# Patient Record
Sex: Female | Born: 1970
Health system: Southern US, Community
[De-identification: ages and names within clinical notes are randomized; demographics above are authoritative.]

## PROBLEM LIST (undated history)

## (undated) ENCOUNTER — Emergency Department (HOSPITAL_COMMUNITY): Payer: Medicare Other | Source: Home / Self Care

## (undated) DIAGNOSIS — I10 Essential (primary) hypertension: Secondary | ICD-10-CM

## (undated) DIAGNOSIS — J45909 Unspecified asthma, uncomplicated: Secondary | ICD-10-CM

## (undated) DIAGNOSIS — E119 Type 2 diabetes mellitus without complications: Secondary | ICD-10-CM

---

## 1999-01-22 ENCOUNTER — Other Ambulatory Visit: Admission: RE | Admit: 1999-01-22 | Discharge: 1999-01-22 | Payer: Self-pay | Admitting: Family Medicine

## 1999-08-24 ENCOUNTER — Other Ambulatory Visit: Admission: RE | Admit: 1999-08-24 | Discharge: 1999-08-24 | Payer: Self-pay | Admitting: Family Medicine

## 2002-08-13 ENCOUNTER — Encounter: Payer: Self-pay | Admitting: *Deleted

## 2002-08-13 ENCOUNTER — Inpatient Hospital Stay (HOSPITAL_COMMUNITY): Admission: AD | Admit: 2002-08-13 | Discharge: 2002-08-16 | Payer: Self-pay | Admitting: *Deleted

## 2002-08-14 ENCOUNTER — Encounter (INDEPENDENT_AMBULATORY_CARE_PROVIDER_SITE_OTHER): Payer: Self-pay

## 2004-04-07 ENCOUNTER — Ambulatory Visit: Payer: Self-pay | Admitting: Family Medicine

## 2004-06-07 ENCOUNTER — Ambulatory Visit: Payer: Self-pay | Admitting: Family Medicine

## 2004-08-09 ENCOUNTER — Ambulatory Visit: Payer: Self-pay | Admitting: Family Medicine

## 2004-11-01 ENCOUNTER — Ambulatory Visit: Payer: Self-pay | Admitting: Family Medicine

## 2004-11-26 ENCOUNTER — Encounter: Admission: RE | Admit: 2004-11-26 | Discharge: 2004-11-26 | Payer: Self-pay | Admitting: Allergy and Immunology

## 2004-11-29 ENCOUNTER — Ambulatory Visit: Payer: Self-pay | Admitting: Family Medicine

## 2004-11-30 ENCOUNTER — Encounter (INDEPENDENT_AMBULATORY_CARE_PROVIDER_SITE_OTHER): Payer: Self-pay | Admitting: Cardiology

## 2004-11-30 ENCOUNTER — Ambulatory Visit (HOSPITAL_COMMUNITY): Admission: RE | Admit: 2004-11-30 | Discharge: 2004-11-30 | Payer: Self-pay | Admitting: Allergy and Immunology

## 2004-12-07 ENCOUNTER — Ambulatory Visit: Payer: Self-pay | Admitting: Family Medicine

## 2005-07-13 ENCOUNTER — Ambulatory Visit: Payer: Self-pay | Admitting: Family Medicine

## 2005-08-17 ENCOUNTER — Other Ambulatory Visit: Admission: RE | Admit: 2005-08-17 | Discharge: 2005-08-17 | Payer: Self-pay | Admitting: Family Medicine

## 2005-08-17 ENCOUNTER — Ambulatory Visit: Payer: Self-pay | Admitting: Family Medicine

## 2005-08-17 ENCOUNTER — Encounter (INDEPENDENT_AMBULATORY_CARE_PROVIDER_SITE_OTHER): Payer: Self-pay | Admitting: Family Medicine

## 2006-02-03 ENCOUNTER — Ambulatory Visit: Payer: Self-pay | Admitting: Family Medicine

## 2006-02-06 ENCOUNTER — Ambulatory Visit: Payer: Self-pay | Admitting: Family Medicine

## 2006-05-03 ENCOUNTER — Ambulatory Visit: Payer: Self-pay | Admitting: Family Medicine

## 2006-08-14 ENCOUNTER — Encounter (INDEPENDENT_AMBULATORY_CARE_PROVIDER_SITE_OTHER): Payer: Self-pay | Admitting: Family Medicine

## 2006-08-14 DIAGNOSIS — F8189 Other developmental disorders of scholastic skills: Secondary | ICD-10-CM

## 2006-08-14 DIAGNOSIS — Z8669 Personal history of other diseases of the nervous system and sense organs: Secondary | ICD-10-CM

## 2006-08-14 DIAGNOSIS — J309 Allergic rhinitis, unspecified: Secondary | ICD-10-CM

## 2006-08-14 DIAGNOSIS — J45909 Unspecified asthma, uncomplicated: Secondary | ICD-10-CM | POA: Insufficient documentation

## 2007-10-23 ENCOUNTER — Emergency Department (HOSPITAL_COMMUNITY): Admission: EM | Admit: 2007-10-23 | Discharge: 2007-10-23 | Payer: Self-pay | Admitting: Emergency Medicine

## 2009-02-04 ENCOUNTER — Ambulatory Visit: Payer: Self-pay | Admitting: Physician Assistant

## 2009-02-04 DIAGNOSIS — N926 Irregular menstruation, unspecified: Secondary | ICD-10-CM | POA: Insufficient documentation

## 2009-02-04 DIAGNOSIS — K219 Gastro-esophageal reflux disease without esophagitis: Secondary | ICD-10-CM

## 2009-02-10 ENCOUNTER — Encounter: Payer: Self-pay | Admitting: Physician Assistant

## 2009-02-10 LAB — CONVERTED CEMR LAB
ALT: 9 units/L (ref 0–35)
AST: 14 units/L (ref 0–37)
Alkaline Phosphatase: 79 units/L (ref 39–117)
BUN: 9 mg/dL (ref 6–23)
Barbiturate Quant, Ur: NEGATIVE
Basophils Absolute: 0 10*3/uL (ref 0.0–0.1)
Basophils Relative: 0 % (ref 0–1)
Calcium: 9.3 mg/dL (ref 8.4–10.5)
Cocaine Metabolites: NEGATIVE
Creatinine, Ser: 0.62 mg/dL (ref 0.40–1.20)
Creatinine,U: 199.4 mg/dL
Eosinophils Absolute: 0.1 10*3/uL (ref 0.0–0.7)
Eosinophils Relative: 2 % (ref 0–5)
HCT: 33.7 % — ABNORMAL LOW (ref 36.0–46.0)
HDL: 44 mg/dL (ref >39)
Lymphs Abs: 2 10*3/uL (ref 0.7–4.0)
MCV: 87.8 fL (ref 78.0–100.0)
Methadone: NEGATIVE
Neutrophils Relative %: 54 % (ref 43–77)
Opiate Screen, Urine: NEGATIVE
Platelets: 243 10*3/uL (ref 150–400)
RDW: 14.1 % (ref 11.5–15.5)
TSH: 0.738 microintl units/mL (ref 0.350–4.500)
Total Bilirubin: 0.3 mg/dL (ref 0.3–1.2)
Total CHOL/HDL Ratio: 3.8
VLDL: 12 mg/dL (ref 0–40)
WBC: 5.5 10*3/uL (ref 4.0–10.5)

## 2009-02-11 DIAGNOSIS — D509 Iron deficiency anemia, unspecified: Secondary | ICD-10-CM

## 2009-02-12 ENCOUNTER — Ambulatory Visit (HOSPITAL_COMMUNITY): Admission: RE | Admit: 2009-02-12 | Discharge: 2009-02-12 | Payer: Self-pay | Admitting: Internal Medicine

## 2009-02-18 LAB — CONVERTED CEMR LAB
Ferritin: 10 ng/mL (ref 10–291)
Saturation Ratios: 6 % — ABNORMAL LOW (ref 20–55)
Vitamin B-12: 243 pg/mL (ref 211–911)

## 2009-02-20 ENCOUNTER — Ambulatory Visit: Payer: Self-pay | Admitting: Internal Medicine

## 2009-02-20 ENCOUNTER — Encounter: Payer: Self-pay | Admitting: Physician Assistant

## 2009-02-24 ENCOUNTER — Telehealth: Payer: Self-pay | Admitting: Physician Assistant

## 2009-02-24 LAB — CONVERTED CEMR LAB: Retic Ct Pct: 1.9 % (ref 0.4–3.1)

## 2009-03-16 ENCOUNTER — Ambulatory Visit: Payer: Self-pay | Admitting: Physician Assistant

## 2009-03-16 DIAGNOSIS — R030 Elevated blood-pressure reading, without diagnosis of hypertension: Secondary | ICD-10-CM | POA: Insufficient documentation

## 2009-03-16 DIAGNOSIS — E049 Nontoxic goiter, unspecified: Secondary | ICD-10-CM | POA: Insufficient documentation

## 2009-03-16 LAB — CONVERTED CEMR LAB
Basophils Absolute: 0 10*3/uL (ref 0.0–0.1)
Basophils Relative: 0 % (ref 0–1)
Eosinophils Absolute: 0.1 10*3/uL (ref 0.0–0.7)
Eosinophils Relative: 1 % (ref 0–5)
Glucose, Urine, Semiquant: NEGATIVE
HCT: 35.1 % — ABNORMAL LOW (ref 36.0–46.0)
Hemoglobin: 11.2 g/dL — ABNORMAL LOW (ref 12.0–15.0)
KOH Prep: NEGATIVE
Ketones, urine, test strip: NEGATIVE
MCHC: 31.9 g/dL (ref 30.0–36.0)
Monocytes Absolute: 0.8 10*3/uL (ref 0.1–1.0)
Nitrite: NEGATIVE
OCCULT 1: NEGATIVE
RDW: 14.5 % (ref 11.5–15.5)
WBC Urine, dipstick: NEGATIVE
pH: 6.5

## 2009-03-17 ENCOUNTER — Encounter: Payer: Self-pay | Admitting: Physician Assistant

## 2009-03-17 ENCOUNTER — Other Ambulatory Visit: Admission: RE | Admit: 2009-03-17 | Discharge: 2009-03-17 | Payer: Self-pay | Admitting: Internal Medicine

## 2009-03-20 ENCOUNTER — Encounter: Payer: Self-pay | Admitting: Physician Assistant

## 2009-03-21 ENCOUNTER — Encounter: Payer: Self-pay | Admitting: Physician Assistant

## 2009-03-24 ENCOUNTER — Ambulatory Visit (HOSPITAL_COMMUNITY): Admission: RE | Admit: 2009-03-24 | Discharge: 2009-03-24 | Payer: Self-pay | Admitting: Internal Medicine

## 2009-03-29 ENCOUNTER — Encounter: Payer: Self-pay | Admitting: Physician Assistant

## 2009-03-30 ENCOUNTER — Encounter (INDEPENDENT_AMBULATORY_CARE_PROVIDER_SITE_OTHER): Payer: Self-pay | Admitting: *Deleted

## 2009-04-27 ENCOUNTER — Ambulatory Visit: Payer: Self-pay | Admitting: Physician Assistant

## 2009-04-27 DIAGNOSIS — D485 Neoplasm of uncertain behavior of skin: Secondary | ICD-10-CM

## 2009-06-10 ENCOUNTER — Ambulatory Visit: Payer: Self-pay | Admitting: Obstetrics and Gynecology

## 2009-06-10 LAB — CONVERTED CEMR LAB
HCT: 36.1 % (ref 36.0–46.0)
MCHC: 32.4 g/dL (ref 30.0–36.0)
MCV: 86.4 fL (ref 78.0–100.0)
Platelets: 222 10*3/uL (ref 150–400)
RDW: 14.9 % (ref 11.5–15.5)

## 2010-04-06 NOTE — Miscellaneous (Signed)
Summary: Pap Smear Normal  Clinical Lists Changes  Observations: Added new observation of PAP SMEAR:  Specimen Adequacy: Satisfactory for evaluation.   Interpretation/Result:Negative for intraepithelial Lesion or Malignancy.    (03/16/2009 21:06)      Pap Smear  Procedure date:  03/16/2009  Findings:       Specimen Adequacy: Satisfactory for evaluation.   Interpretation/Result:Negative for intraepithelial Lesion or Malignancy.     Comments:      Repeat Pap in 1 year.

## 2010-04-06 NOTE — Assessment & Plan Note (Signed)
Summary: FOLLOW UP IN 6 WEEKS WITH Angela Daugherty FOR BP AND ANEMIA//GK   Vital Signs:  Patient profile:   40 year old female Height:      65.5 inches Weight:      217 pounds BMI:     35.69 Temp:     97.9 degrees F oral Pulse rate:   82 / minute Pulse rhythm:   regular Resp:     18 per minute BP sitting:   138 / 90  (left arm) Cuff size:   large  Vitals Entered By: Angela Daugherty (April 27, 2009 9:58 AM)  Serial Vital Signs/Assessments:  Time      Position  BP       Pulse  Resp  Temp     By 10:44 AM            130/94                         Angela Newcomer PA-C 10:45 AM            146/98                         Angela Newcomer PA-C  Comments: 10:44 AM left arm   By: Angela Newcomer PA-C  10:45 AM right arm  By: Angela Newcomer PA-C   CC: F/U... Is Patient Diabetic? No Pain Assessment Patient in pain? no       Does patient need assistance? Functional Status Self care Ambulation Normal   CC:  F/U....  History of Present Illness: Here for f/u.  Dysfunctional uterine bleeding:  Had a smal fibroid on ultrasound.  Mild iron deficient anemia.  Still not taking iron.  Has not gotten appt with GYN yet.  Now noting periods once a month.  No heavy cycle noted this month.  Thyromegaly:  Ultrasound with bilat nodules < 1cm.  No dominant nodule.  Patient had normal TSH.  Rec to f/u with ultrasound in 6 mos.    Asthma History    Asthma Control Assessment:    Age range: 12+ years    Symptoms: 0-2 days/week    Nighttime Awakenings: 0-2/month    Interferes w/ normal activity: no limitations    SABA use (not for EIB): 0-2 days/week    Asthma Control Assessment: Well Controlled   Problems Prior to Update: 1)  Lesion, Scalp  (ICD-238.2) 2)  Elevated Bp Reading Without Dx Hypertension  (ICD-796.2) 3)  Thyromegaly  (ICD-240.9) 4)  Anemia-iron Deficiency  (ICD-280.9) 5)  Irregular Menses  (ICD-626.4) 6)  Obesity  (ICD-278.00) 7)  Preventive Health Care  (ICD-V70.0) 8)  Gerd   (ICD-530.81) 9)  Family History Diabetes 1st Degree Relative  (ICD-V18.0) 10)  Learning Disability  (ICD-315.2) 11)  Meningitis, Hx of  (ICD-V12.49) 12)  Asthma  (ICD-493.90) 13)  Allergic Rhinitis  (ICD-477.9)  Current Medications (verified): 1)  Allegra 180 Mg Tabs (Fexofenadine Hcl) .Marland Kitchen.. 1 By Mouth Once Daily 2)  Nexium 40 Mg Cpdr (Esomeprazole Magnesium) .Marland Kitchen.. 1 By Mouth Once Daily 3)  Qvar 80 Mcg/act Aers (Beclomethasone Dipropionate) .Marland Kitchen.. 1 Inhalation Two Times A Day 4)  Proventil 90 Mcg/act Aers (Albuterol) .... As Needed 5)  Tessalon Perles 100 Mg Caps (Benzonatate) .... Q8h Pern Cough 6)  Ferrous Sulfate 325 (65 Fe) Mg Tabs (Ferrous Sulfate) .... Take 1 Tablet By Mouth Three Times A Day  Allergies (verified): No Known Drug Allergies  Review of Systems  See HPI General:  Denies chills, fever, sweats, and weight loss. CV:  Denies chest pain or discomfort. GI:  Denies bloody stools, dark tarry stools, and vomiting blood. GU:  Denies hematuria. Derm:  See HPI.  Physical Exam  General:  alert, well-developed, and well-nourished.   Head:  normocephalic and atraumatic.   Neck:  supple.   Lungs:  normal breath sounds, no crackles, and no wheezes.   Heart:  normal rate and regular rhythm.   Neurologic:  alert & oriented X3 and cranial nerves II-XII intact.   Skin:  approx 2 cm pedunculated mass with hypo and hyper pigmentation on scalp in the mid to post parietal region Psych:  normally interactive.   answers cell phone and starts talking during interview    Impression & Recommendations:  Problem # 1:  LESION, SCALP (ICD-238.2)  ? skin tag rec she see derm to have it removed  Orders: Dermatology Referral (Derma)  Problem # 2:  ELEVATED BP READING WITHOUT DX HYPERTENSION (ICD-796.2) continue to monitor closely will have her watch salt and exercise if still above 140/90 at bp check, consider putting her on HCTZ 12.5 mg once daily  Problem # 3:  THYROMEGALY  (ICD-240.9) < 1cm nodules needs f/u TFTs and U/S in 6 mos  Problem # 4:  ASTHMA (ICD-493.90) controlled  Her updated medication list for this problem includes:    Qvar 80 Mcg/act Aers (Beclomethasone dipropionate) .Marland Kitchen... 1 inhalation two times a day    Proventil 90 Mcg/act Aers (Albuterol) .Marland Kitchen... As needed  Problem # 5:  ANEMIA-IRON DEFICIENCY (ICD-280.9) start Iron waiting on appt with GYN  Her updated medication list for this problem includes:    Ferrous Sulfate 325 (65 Fe) Mg Tabs (Ferrous sulfate) .Marland Kitchen... Take 1 tablet by mouth three times a day  Complete Medication List: 1)  Allegra 180 Mg Tabs (Fexofenadine hcl) .Marland Kitchen.. 1 by mouth once daily 2)  Nexium 40 Mg Cpdr (Esomeprazole magnesium) .Marland Kitchen.. 1 by mouth once daily 3)  Qvar 80 Mcg/act Aers (Beclomethasone dipropionate) .Marland Kitchen.. 1 inhalation two times a day 4)  Proventil 90 Mcg/act Aers (Albuterol) .... As needed 5)  Tessalon Perles 100 Mg Caps (Benzonatate) .... Q8h pern cough 6)  Ferrous Sulfate 325 (65 Fe) Mg Tabs (Ferrous sulfate) .... Take 1 tablet by mouth three times a day  Patient Instructions: 1)  It is important that you exercise reguarly at least 20 minutes 5 times a week. If you develop chest pain, have severe difficulty breathing, or feel very tired, stop exercising immediately and seek medical attention.  2)  Watch salt in your diet. 3)  See the handout. 4)  Return in 2 weeks for a blood pressure check with the nurse. 5)  Please schedule a follow-up appointment in 1 month with Angela Daugherty for blood pressure.

## 2010-04-06 NOTE — Letter (Signed)
Summary: *HSN Results Follow up  [facility]  [address] [place], [place] [address]   Phone: [phone]  Fax: [phone]      03/30/2009   Billiejean AURILLA COULIBALY [address] [place], [place]  [address]   Dear  Ms. Mekenna Hudson,                            ____S.Drinkard,FNP   ____D. Gore,FNP       ____B. McPherson,MD   ____V. Rankins,MD    ____E. Mulberry,MD    ____N. Daphine Deutscher, FNP  ____D. Reche Dixon, MD    ____K. Philipp Deputy, MD    ____Other     This letter is to inform you that your recent test(s):  _______Pap Smear    _______Lab Test     ___X____X-ray    ___X____ is within acceptable limits  _______ requires a medication change  _______ requires a follow-up lab visit  _______ requires a follow-up visit with your provider   Comments:  Your thyroid ultrasound is normal.  We would like for you to repeat the ultrasound in six (6) months.       _________________________________________________________ If you have any questions, please contact our office                     Sincerely,  [name] [facility]

## 2010-04-06 NOTE — Letter (Signed)
Summary: GYNECOLOGIC CYTOLOGY REPORT  GYNECOLOGIC CYTOLOGY REPORT   Imported By: Arta Bruce 05/08/2009 11:51:16  _____________________________________________________________________  External Attachment:    Type:   Image     Comment:   External Document

## 2010-04-06 NOTE — Letter (Signed)
Summary: *HSN Results Follow up  [facility]  [address] [place], [place] [address]   Phone: [phone]  Fax: [phone]      03/17/2009   Angela Daugherty [address] [place], [place]  [address]   Dear  [name],                            ____S.Drinkard,FNP   ____D. Gore,FNP       ____B. McPherson,MD   ____V. Rankins,MD    ____E. Mulberry,MD    ____N. Daphine Deutscher, FNP  ____D. Reche Dixon, MD    ____K. Philipp Deputy, MD    __x__S. Alben Spittle, PA-C     This letter is to inform you that your recent test(s):  _______Pap Smear    ___x____Lab Test     _______X-ray    _______ is within acceptable limits  _______ requires a medication change  _______ requires a follow-up lab visit  _______ requires a follow-up visit with your provider   Comments:  Blood counts are slightly improved.  Continue the iron.  Test for infection was negative.  Your pap smear results have not come back.  We will send you a letter once they do come back.       _________________________________________________________ If you have any questions, please contact our office                     Sincerely,  Tereso Newcomer PA-C [facility]

## 2010-04-06 NOTE — Assessment & Plan Note (Signed)
Summary: cpp////cns   Vital Signs:  Patient profile:   40 year old female LMP:     03/03/2009 Weight:      221 pounds Temp:     98.3 degrees F Pulse rate:   91 / minute Pulse rhythm:   regular Resp:     20 per minute BP sitting:   148 / 83  (left arm) Cuff size:   large  Vitals Entered By: Vesta Mixer CMA (March 16, 2009 2:11 PM) CC: CPP Is Patient Diabetic? No Pain Assessment Patient in pain? no       Does patient need assistance? Ambulation Normal LMP (date): 03/03/2009     Enter LMP: 03/03/2009 Last PAP Result Normal   CC:  CPP.  History of Present Illness: Here for CPP. When last seen had labs that demonstrated iron deficiency anemia. She has a long h/o menorrhagia.  Sometimes has 2-3 periods a month.  No cramping or clotting. No vaginal discharge, itching or odor. Rarely sexually active with one partner. Does not take calcium. Mammo done last month . . . . normal. No FHx breast CA or ovarian CA.   Asthma History    Asthma Control Assessment:    Age range: 12+ years    Symptoms: 0-2 days/week    Nighttime Awakenings: 0-2/month    Interferes w/ normal activity: no limitations    SABA use (not for EIB): 0-2 days/week    Asthma Control Assessment: Well Controlled  Habits & Providers  Alcohol-Tobacco-Diet     Alcohol drinks/day: 0     Tobacco Status: never  Exercise-Depression-Behavior     Does Patient Exercise: yes     Type of exercise: aerobics videos     Times/week: <3     Have you felt down or hopeless? no     Have you felt little pleasure in things? no     STD Risk: never     Drug Use: never     Seat Belt Use: always  Allergies (verified): No Known Drug Allergies  Family History: Family History Diabetes 1st degree relative - mom Family History Hypertension - mom no breast or ovarian cancer  Social History: Occupation: PT; Land Single Rarely sexually active; one partner One son 20 yo Never Smoked Alcohol  use-no Drug use-no Drug Use:  never STD Risk:  never Risk analyst Use:  always Does Patient Exercise:  yes  Review of Systems  The patient denies fever, chest pain, syncope, dyspnea on exertion, hemoptysis, melena, hematochezia, hematuria, depression, and breast masses.         see HPI rest of ROS negative  Physical Exam  General:  alert, well-developed, and well-nourished.   Head:  normocephalic and atraumatic.   Eyes:  pupils equal, pupils round, and pupils reactive to light.   Ears:  R ear normal and L ear normal.   Nose:  no external deformity.   Mouth:  pharynx pink and moist, no erythema, and no exudates.   Neck:  supple, no thyroid nodules or tenderness, no carotid bruits, no cervical lymphadenopathy, and thyromegaly.   Breasts:  skin/areolae normal, no masses, no abnormal thickening, no nipple discharge, no tenderness, and no adenopathy.   Lungs:  normal breath sounds, no crackles, and no wheezes.   Heart:  normal rate, regular rhythm, and no murmur.   Abdomen:  soft, non-tender, and no hepatomegaly.   Rectal:  no external abnormalities, no hemorrhoids, normal sphincter tone, and no masses.   Genitalia:  normal introitus, no  external lesions, no vaginal discharge, mucosa pink and moist, no vaginal or cervical lesions, no vaginal atrophy, no friaility or hemorrhage, normal uterus size and position, and no adnexal masses or tenderness.   Msk:  normal ROM.   Pulses:  DP/PT 2+ bilat Extremities:  no edema Neurologic:  alert & oriented X3 and cranial nerves II-XII intact.   Skin:  turgor normal.   Psych:  normally interactive and good eye contact.     Impression & Recommendations:  Problem # 1:  PREVENTIVE HEALTH CARE (ICD-V70.0) pap today  Orders: KOH/ WET Mount [phone]) T- GC Chlamydia ([address]) T-Pap Smear, Thin Prep (09811)  Problem # 2:  ANEMIA-IRON DEFICIENCY (ICD-280.9) on iron check cbc get stool cards likely related to menorrhagia get ultrasound . . .  consider referral to GYN clinic  Her updated medication list for this problem includes:    Ferrous Sulfate 325 (65 Fe) Mg Tabs (Ferrous sulfate) .Marland Kitchen... Take 1 tablet by mouth three times a day  Orders: T-CBC w/Diff ([phone]) Hemoccult Cards -3 specimans (take home) ([address]) Ultrasound (Ultrasound)  Problem # 3:  THYROMEGALY (ICD-240.9)  on exam recent TSH normal get u/s  Orders: Ultrasound (Ultrasound)  Problem # 4:  ELEVATED BP READING WITHOUT DX HYPERTENSION (ICD-796.2) monitor  Complete Medication List: 1)  Allegra 180 Mg Tabs (Fexofenadine hcl) .Marland Kitchen.. 1 by mouth once daily 2)  Nexium 40 Mg Cpdr (Esomeprazole magnesium) .Marland Kitchen.. 1 by mouth once daily 3)  Qvar 80 Mcg/act Aers (Beclomethasone dipropionate) .Marland Kitchen.. 1 inhalation two times a day 4)  Proventil 90 Mcg/act Aers (Albuterol) .... As needed 5)  Tessalon Perles 100 Mg Caps (Benzonatate) .... Q8h pern cough 6)  Ferrous Sulfate 325 (65 Fe) Mg Tabs (Ferrous sulfate) .... Take 1 tablet by mouth three times a day   Patient Instructions: 1)  Take calcium 600 mg +vitamin D 400 IU twice daily.  2)  Return in 2 weeks for blood pressure check with the nurse. 3)  Please schedule a follow-up appointment in 6 weeks with Glynna Failla for blood pressure and anemia. 4)     Laboratory Results   Urine Tests    Routine Urinalysis   Glucose: negative   (Normal Range: Negative) Bilirubin: negative   (Normal Range: Negative) Ketone: negative   (Normal Range: Negative) Spec. Gravity: <1.005   (Normal Range: 1.003-1.035) Blood: negative   (Normal Range: Negative) pH: 6.5   (Normal Range: 5.0-8.0) Protein: negative   (Normal Range: Negative) Urobilinogen: 1.0   (Normal Range: 0-1) Nitrite: negative   (Normal Range: Negative) Leukocyte Esterace: negative   (Normal Range: Negative)      Wet Mount Source: vaginal WBC/hpf: 1-5 Bacteria/hpf: rare Clue cells/hpf: none  Negative whiff Yeast/hpf: none Wet Mount KOH:  Negative Trichomonas/hpf: none  Stool - Occult Blood Hemmoccult #1: negative Date: 03/16/2009    Appended Document: cpp////cns US TRANSVAGINAL NON-OB - [id number]   Clinical Data: Irregular menses.  LMP 02/18/2009   TRANSABDOMINAL AND TRANSVAGINAL ULTRASOUND OF PELVIS   Technique:  Both transabdominal and transvaginal ultrasound examinations of the pelvis were performed including evaluation of the uterus, ovaries, adnexal regions, and pelvic cul-de-sac.   Comparison:  None.   Findings:   Uterus the uterus demonstrates a sagittal length of 9.3 cm, an AP width of 5.1 cm and a transverse width of 5.5 cm.  One small area of focally altered echotexture is identified in the posterior upper uterine segment measuring 8 x 8 by 7 mm.  This has a partial sub serosal component and  is compatible with a small fibroid.  The remainder of the myometrium is homogeneous   Endometrium is homogeneously echogenic and demonstrates an AP width of 11.9 mm.  No areas of focal thickening or inhomogeneity are seen and this would correlate with a presecretory endometrial stripe and the patient's given LMP of 02/18/2009.   Right Ovary measures 4.8 x 2.6 x 3.2 cm.  The central stroma has a somewhat increased echotexture which can sometimes be seen with polycystic ovarian syndrome.  No increase in central vascularity or peripheral follicles are seen however   Left Ovary measures 5.8 x 2.1 by 2.8 cm.  Again the central stroma has a slightly increased echogenicity but no increased central flow or increase in peripherally oriented follicles are noted.   Other Findings:  No pelvic fluid or separate adnexal masses are noted.   IMPRESSION: Small focal fibroid with size and location as described above. Normal presecretory endometrial stripe.  It should be noted that in this phase of the cycle, small focal abnormalities can be obscured and if clinical concern warrants, reassessment in the  immediate postsecretory phase of the cycle would be useful.   Both ovaries are slightly prominent in size and demonstrates some increased central echogenicity.  These findings can be seen with polycystic ovarian syndrome, however the ancillary findings of increased central vascularity and peripherally oriented follicles are not seen. Clinical correlation is recommended.   Read By:  Bertha Stakes,  M.D.     Released By:  Bertha Stakes,  M.D.  _____________________________________________________________________  External Attachment:    Type:     Image     Comment:  US TRANSVAGINAL NON-OB - [id number]  Signed by Tereso Newcomer PA-C on 03/29/2009 at 9:33 PM  ________________________________________________________________________ no amenorrhea . . . doubt PCOS with heavy menses, fibroid tumor noted and iron deficiency anemia, will refer to GYN    Signed by Tereso Newcomer PA-C on 03/29/2009 at 9:33 PM

## 2010-04-06 NOTE — Letter (Signed)
Summary: TEST ORDER FORM/ULTRASOUND//APPT DATE & TIME  TEST ORDER FORM/ULTRASOUND//APPT DATE & TIME   Imported By: Arta Bruce 04/28/2009 11:53:07  _____________________________________________________________________  External Attachment:    Type:   Image     Comment:   External Document

## 2010-04-06 NOTE — Letter (Signed)
Summary: *HSN Results Follow up  [facility]  [address] [place], [place] [address]   Phone: [phone]  Fax: [phone]      03/21/2009   Angela Daugherty [address] [place], [place]  [address]   Dear  [name],                            ____S.Drinkard,FNP   ____D. Gore,FNP       ____B. McPherson,MD   ____V. Rankins,MD    ____E. Mulberry,MD    ____N. Daphine Deutscher, FNP  ____D. Reche Dixon, MD    ____K. Philipp Deputy, MD    __x__S. Alben Spittle, PA-C   This letter is to inform you that your recent test(s):  ___x____Pap Smear    _______Lab Test     _______X-ray    ___x____ is within acceptable limits  _______ requires a medication change  _______ requires a follow-up lab visit  _______ requires a follow-up visit with your provider   Comments:       _________________________________________________________ If you have any questions, please contact our office                     Sincerely,  Tereso Newcomer PA-C [facility]

## 2010-04-06 NOTE — Progress Notes (Signed)
Summary: Office Visit/DEPRESSION SCREENING  Office Visit/DEPRESSION SCREENING   Imported By: Arta Bruce 04/30/2009 14:25:00  _____________________________________________________________________  External Attachment:    Type:   Image     Comment:   External Document

## 2010-04-06 NOTE — Letter (Signed)
Summary: Handout Printed  Printed Handout:  - Diet - Sodium-Controlled 

## 2010-04-06 NOTE — Letter (Signed)
Summary: *HSN Results Follow up  [facility]  [address] [place], [place] [address]   Phone: [phone]  Fax: [phone]      03/29/2009   Michiko [name] [address] [place], [place]  [address]   Dear  [name],                            ____S.Drinkard,FNP   ____D. Gore,FNP       ____B. McPherson,MD   ____V. Rankins,MD    ____E. Mulberry,MD    ____N. Daphine Deutscher, FNP  ____D. Reche Dixon, MD    ____K. Philipp Deputy, MD    __x__S. Alben Spittle, PA-C     This letter is to inform you that your recent test(s):  _______Pap Smear    _______Lab Test     _______X-ray    _______ is within acceptable limits  _______ requires a medication change  _______ requires a follow-up lab visit  _______ requires a follow-up visit with your provider   Comments:  Thyroid ultrasound showed some nodules that are too small to be tested further.  So, I suggest you get a repeat ultrasound in 6 months along with repeat lab work.       _________________________________________________________ If you have any questions, please contact our office                     Sincerely,  Tereso Newcomer PA-C [facility]

## 2010-04-16 ENCOUNTER — Ambulatory Visit
Admission: RE | Admit: 2010-04-16 | Discharge: 2010-04-16 | Disposition: A | Discharge status: Home / Self Care | Payer: Medicaid Other | Source: Ambulatory Visit | Attending: Allergy and Immunology | Admitting: Allergy and Immunology

## 2010-04-16 ENCOUNTER — Other Ambulatory Visit: Payer: Self-pay | Admitting: Allergy and Immunology

## 2010-04-16 DIAGNOSIS — J45901 Unspecified asthma with (acute) exacerbation: Secondary | ICD-10-CM

## 2010-05-26 LAB — POCT PREGNANCY, URINE: Preg Test, Ur: NEGATIVE

## 2010-07-23 NOTE — Discharge Summary (Signed)
   NAME:  Angela Daugherty, Angela Daugherty                          ACCOUNT NO.:  [id number]   MEDICAL RECORD NO.:  [id number]                   PATIENT TYPE:  INP   LOCATION:  9305                                 FACILITY:  [facility]   PHYSICIAN:  Phil D. Okey Dupre, M.D.                  DATE OF BIRTH:  1970/03/31   DATE OF ADMISSION:  08/14/2002  DATE OF DISCHARGE:  08/15/2002                                 DISCHARGE SUMMARY   HISTORY:  This is a 40 year old, gravida 1, para 1-0-0-1, with no known last  menstrual period who had been dated at [redacted] weeks gestation by ultrasound was  admitted because of leakage of amniotic fluid.  A sterile vaginal exam in  the [facility], small parts and a bulging amniotic sac were seen.  Patient was  advised this was an inevitable abortion and __________in the morning, was  taken to the labor and delivery floor where she spontaneously delivered at  1620 hours a nonviable female fetus; this was secondary to active  chorioamnionitis.  Neonatology was there to confirm the nonviability of the  baby, and spontaneous passage of placenta was accomplished.  Patient was  kept on antibiotics post delivery and discharged the following morning, August 15, 2002, to be followed up at a GYN clinic.  Discharge instructions, as per  activity and followup, were given to the patient.                                               Phil D. Okey Dupre, M.D.    PDR/MEDQ  D:  09/03/2002  T:  09/03/2002  Job:  [id number]

## 2010-11-30 ENCOUNTER — Other Ambulatory Visit: Payer: Self-pay | Admitting: Surgery

## 2012-07-20 ENCOUNTER — Encounter (HOSPITAL_COMMUNITY): Payer: Self-pay | Admitting: Emergency Medicine

## 2012-07-20 ENCOUNTER — Emergency Department (HOSPITAL_COMMUNITY)
Admission: EM | Admit: 2012-07-20 | Discharge: 2012-07-20 | Disposition: A | Discharge status: Home / Self Care | Payer: Medicare Other | Attending: Emergency Medicine | Admitting: Emergency Medicine

## 2012-07-20 DIAGNOSIS — M436 Torticollis: Secondary | ICD-10-CM | POA: Diagnosis not present

## 2012-07-20 DIAGNOSIS — J45909 Unspecified asthma, uncomplicated: Secondary | ICD-10-CM | POA: Insufficient documentation

## 2012-07-20 DIAGNOSIS — I10 Essential (primary) hypertension: Secondary | ICD-10-CM | POA: Diagnosis not present

## 2012-07-20 DIAGNOSIS — L0291 Cutaneous abscess, unspecified: Secondary | ICD-10-CM

## 2012-07-20 DIAGNOSIS — L0211 Cutaneous abscess of neck: Secondary | ICD-10-CM | POA: Insufficient documentation

## 2012-07-20 HISTORY — DX: Essential (primary) hypertension: I10

## 2012-07-20 HISTORY — DX: Unspecified asthma, uncomplicated: J45.909

## 2012-07-20 MED ORDER — CEPHALEXIN 500 MG PO CAPS: 500.0000 mg | ORAL_CAPSULE | Freq: Four times a day (QID) | ORAL | Status: DC

## 2012-07-20 MED ORDER — HYDROCODONE-ACETAMINOPHEN 5-325 MG PO TABS: 2.0000 | ORAL_TABLET | Freq: Four times a day (QID) | ORAL | Status: DC | PRN

## 2012-07-20 MED ORDER — SULFAMETHOXAZOLE-TRIMETHOPRIM 800-160 MG PO TABS: 1.0000 | ORAL_TABLET | Freq: Two times a day (BID) | ORAL | Status: DC

## 2012-07-20 NOTE — ED Provider Notes (Signed)
History    This chart was scribed for Junious Silk, PA working with Geoffery Lyons, MD by ED Scribe, Burman Nieves. This patient was seen in room WTR9/WTR9 and the patient's care was started at 8:01 PM.   CSN: [id number]  Arrival date & time 07/20/12  1948   First MD Initiated Contact with Patient 07/20/12 2001      Chief Complaint  Patient presents with  . Abscess    (Consider location/radiation/quality/duration/timing/severity/associated sxs/prior treatment) The history is provided by the patient. No language interpreter was used.   HPI Comments: Angela Daugherty is a 42 y.o. female who presents to the Emergency Department complaining of an abscess on the back of her neck for the past 3-4weeks which has progressively gotten worse.Pty states that she attempted to perm her hair and burnt herself. Pt states the abscess started to drain yesterday. She states she has moderate constant neck pain and movement seems to exacerbate the pain. Pt denies fever, chills, cough, nausea, vomiting, diarrhea, SOB, weakness, and any other associated symptoms. Pt states that she has had abscess's before and they usually drain on there own. Pt's PCP is Dr. Alben Spittle.    Past Medical History  Diagnosis Date  . Hypertension   . Asthma     History reviewed. No pertinent past surgical history.  No family history on file.  History  Substance Use Topics  . Smoking status: Never Smoker   . Smokeless tobacco: Not on file  . Alcohol Use: No    OB History   Grav Para Term Preterm Abortions TAB SAB Ect Mult Living                  Review of Systems  HENT: Positive for neck pain and neck stiffness.   Skin:       abscess  All other systems reviewed and are negative.    Allergies  Review of patient's allergies indicates no known allergies.  Home Medications  No current outpatient prescriptions on file.  BP 133/96  Pulse 99  Temp(Src) 99.7 F (37.6 C) (Oral)  Resp 16  Ht 5\' 6"  (1.676 m)  Wt  230 lb (104.327 kg)  BMI 37.14 kg/m2  SpO2 97%  LMP 07/17/2012  Physical Exam  Nursing note and vitals reviewed. Constitutional: She is oriented to person, place, and time. She appears well-developed and well-nourished. No distress.  HENT:  Head: Normocephalic and atraumatic.  Right Ear: External ear normal.  Left Ear: External ear normal.  Nose: Nose normal.  Mouth/Throat: Oropharynx is clear and moist.  Eyes: Conjunctivae are normal.  Neck: Normal range of motion.  Cardiovascular: Normal rate, regular rhythm and normal heart sounds.   Pulmonary/Chest: Effort normal and breath sounds normal. No stridor. No respiratory distress. She has no wheezes. She has no rales.  Abdominal: Soft. She exhibits no distension.  Musculoskeletal: Normal range of motion. She exhibits tenderness.  Neurological: She is alert and oriented to person, place, and time. She has normal strength.  Skin: Skin is warm and dry. She is not diaphoretic. No erythema.  4 cm area of induration on the back of her neck with active purulent discharge.  Psychiatric: She has a normal mood and affect. Her behavior is normal.    ED Course  Procedures (including critical care time) DIAGNOSTIC STUDIES: Oxygen Saturation is 97% on room air, adequate by my interpretation.    COORDINATION OF CARE: 8:11 PM Discussed ED treatment with pt and pt agrees.  8:45 PM Abscess draining  procedure.   INCISION AND DRAINAGE Performed by: Junious Silk Consent: Verbal consent obtained. Risks and benefits: risks, benefits and alternatives were discussed Type: abscess  Body area: back of the neck  Anesthesia: local infiltration  Incision was made with a scalpel.  Local anesthetic: lidocaine 2% 1 epinephrine  Anesthetic total: 3 ml  Complexity: complex Blunt dissection to break up loculations  Drainage: purulent  Drainage amount: mild   Patient tolerance: Patient tolerated the procedure well with no immediate  complications.     Labs Reviewed - No data to display No results found.   1. Abscess       MDM  Patient with skin abscess amenable to incision and drainage.  Abscess was not large enough to warrant packing or drain,  wound recheck in 2 days. Encouraged home warm soaks and flushing.  Signs of cellulitis is surrounding skin.  Will d/c to home.  Antibiotic therapy is indicated, given Bactrim and Keflex.       I personally performed the services described in this documentation, which was scribed in my presence. The recorded information has been reviewed and is accurate.     Mora Bellman, PA-C 07/21/12 1050

## 2012-07-20 NOTE — ED Notes (Signed)
Pt c/o abscess to back of neck x 2 weeks, pt states she permed hair making area irritated, now draining. Denies n/v/d

## 2012-07-21 NOTE — ED Provider Notes (Signed)
Medical screening examination/treatment/procedure(s) were conducted as a shared visit with non-physician practitioner(s) and myself.  I personally evaluated the patient during the encounter.  The patient presents with a 3-4 day history of swollen, tender area to the back of the neck.  No injury or trauma.    On exam, the patient is afebrile and the vitals are stable.  There is a swollen, draining, erythematous lesion to the back of the neck.  The drainage is purulent and thick.    The abscess was I and Ded by Gavin Pound and will be treated with bactrim, keflex, and pain meds.  Warm soaks and follow up in three days.  Geoffery Lyons, MD 07/21/12 [phone]

## 2012-09-29 IMAGING — CR DG CHEST 2V
2 series · 2 of 2 positions shown · non-contrast
Comparison: [HOSPITAL] at [REDACTED] x-ray
11/26/2004.

CLINICAL DATA: Cough, shortness of breath, nonsmoker, acute asthma.

CHEST - 2 VIEW

[w chest pa]
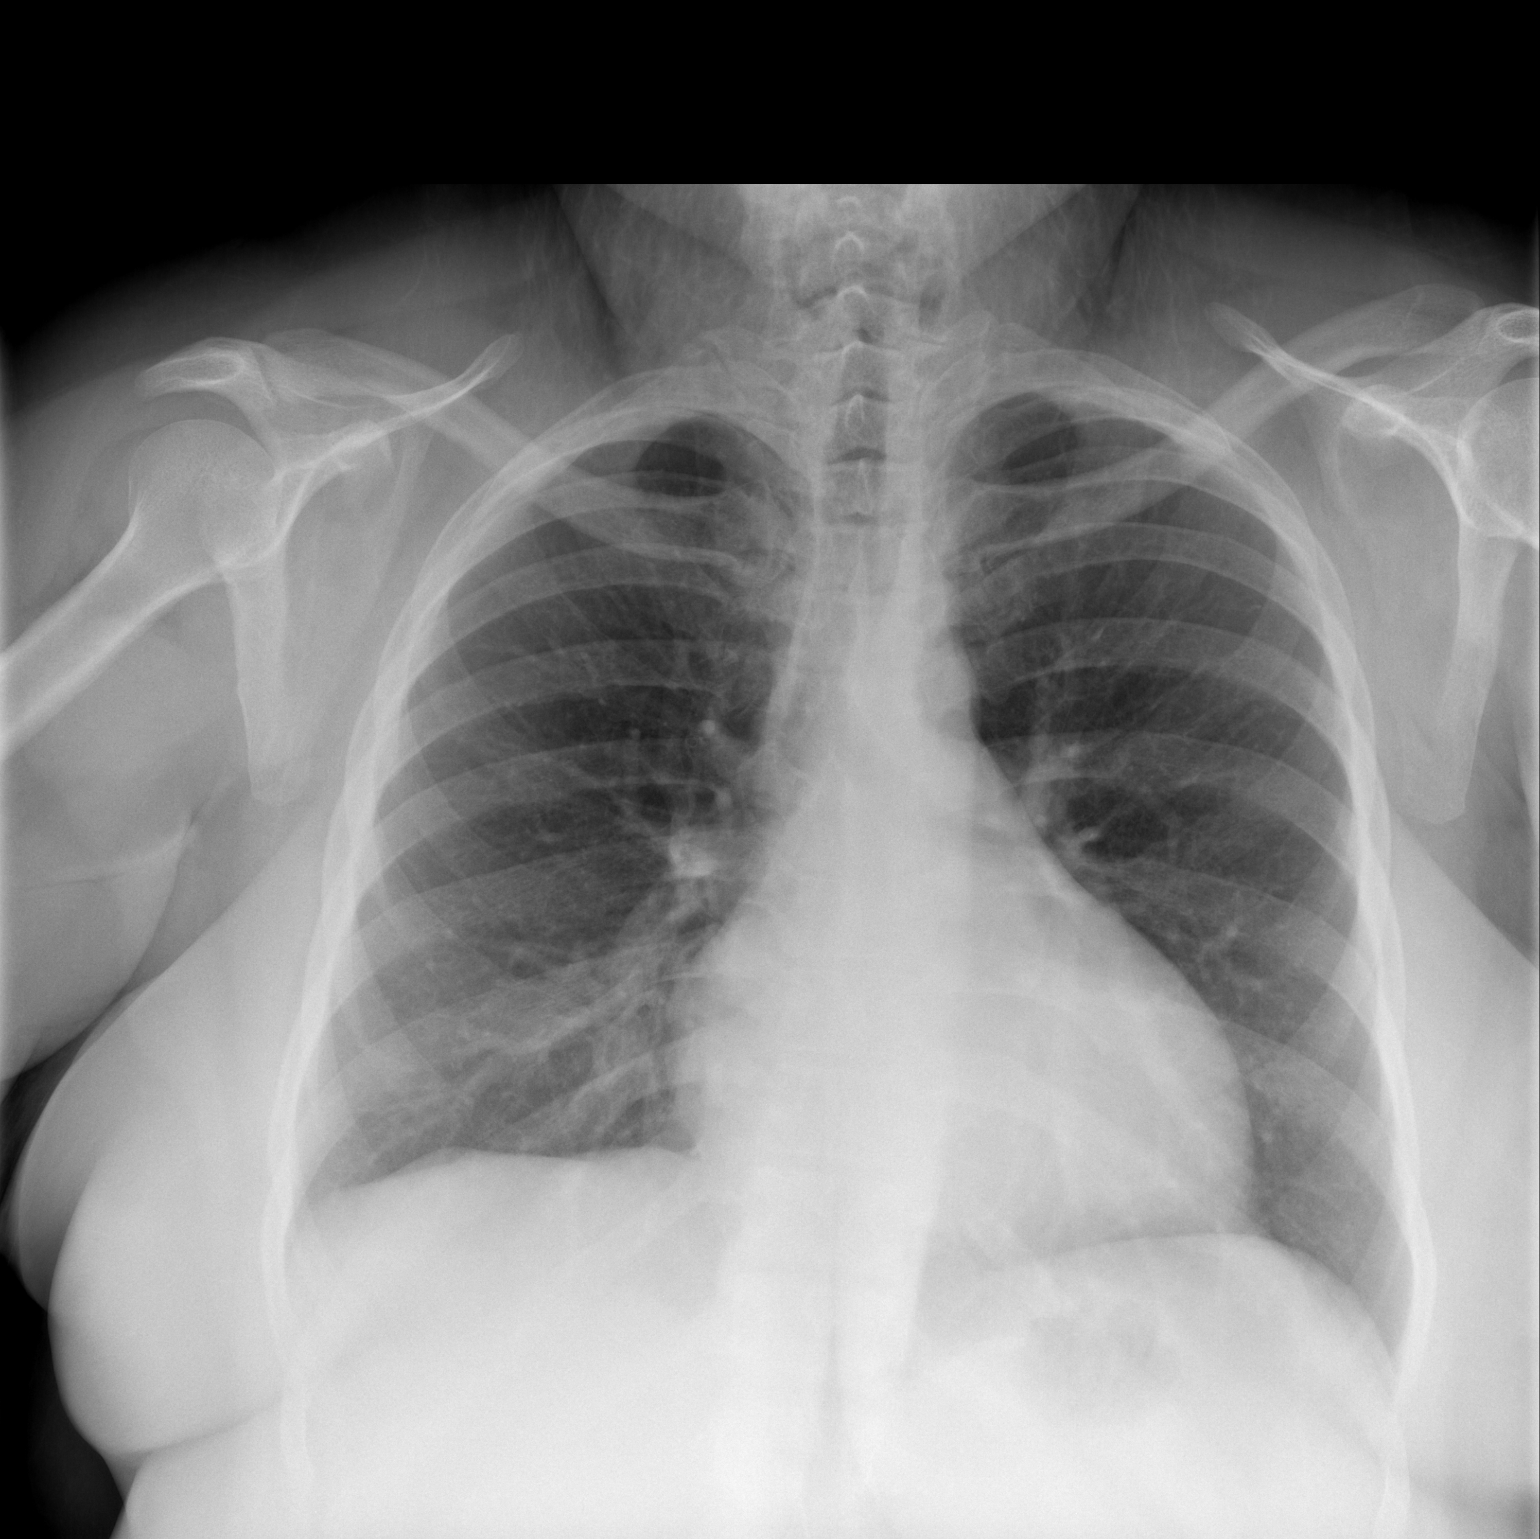

[w chest lat]
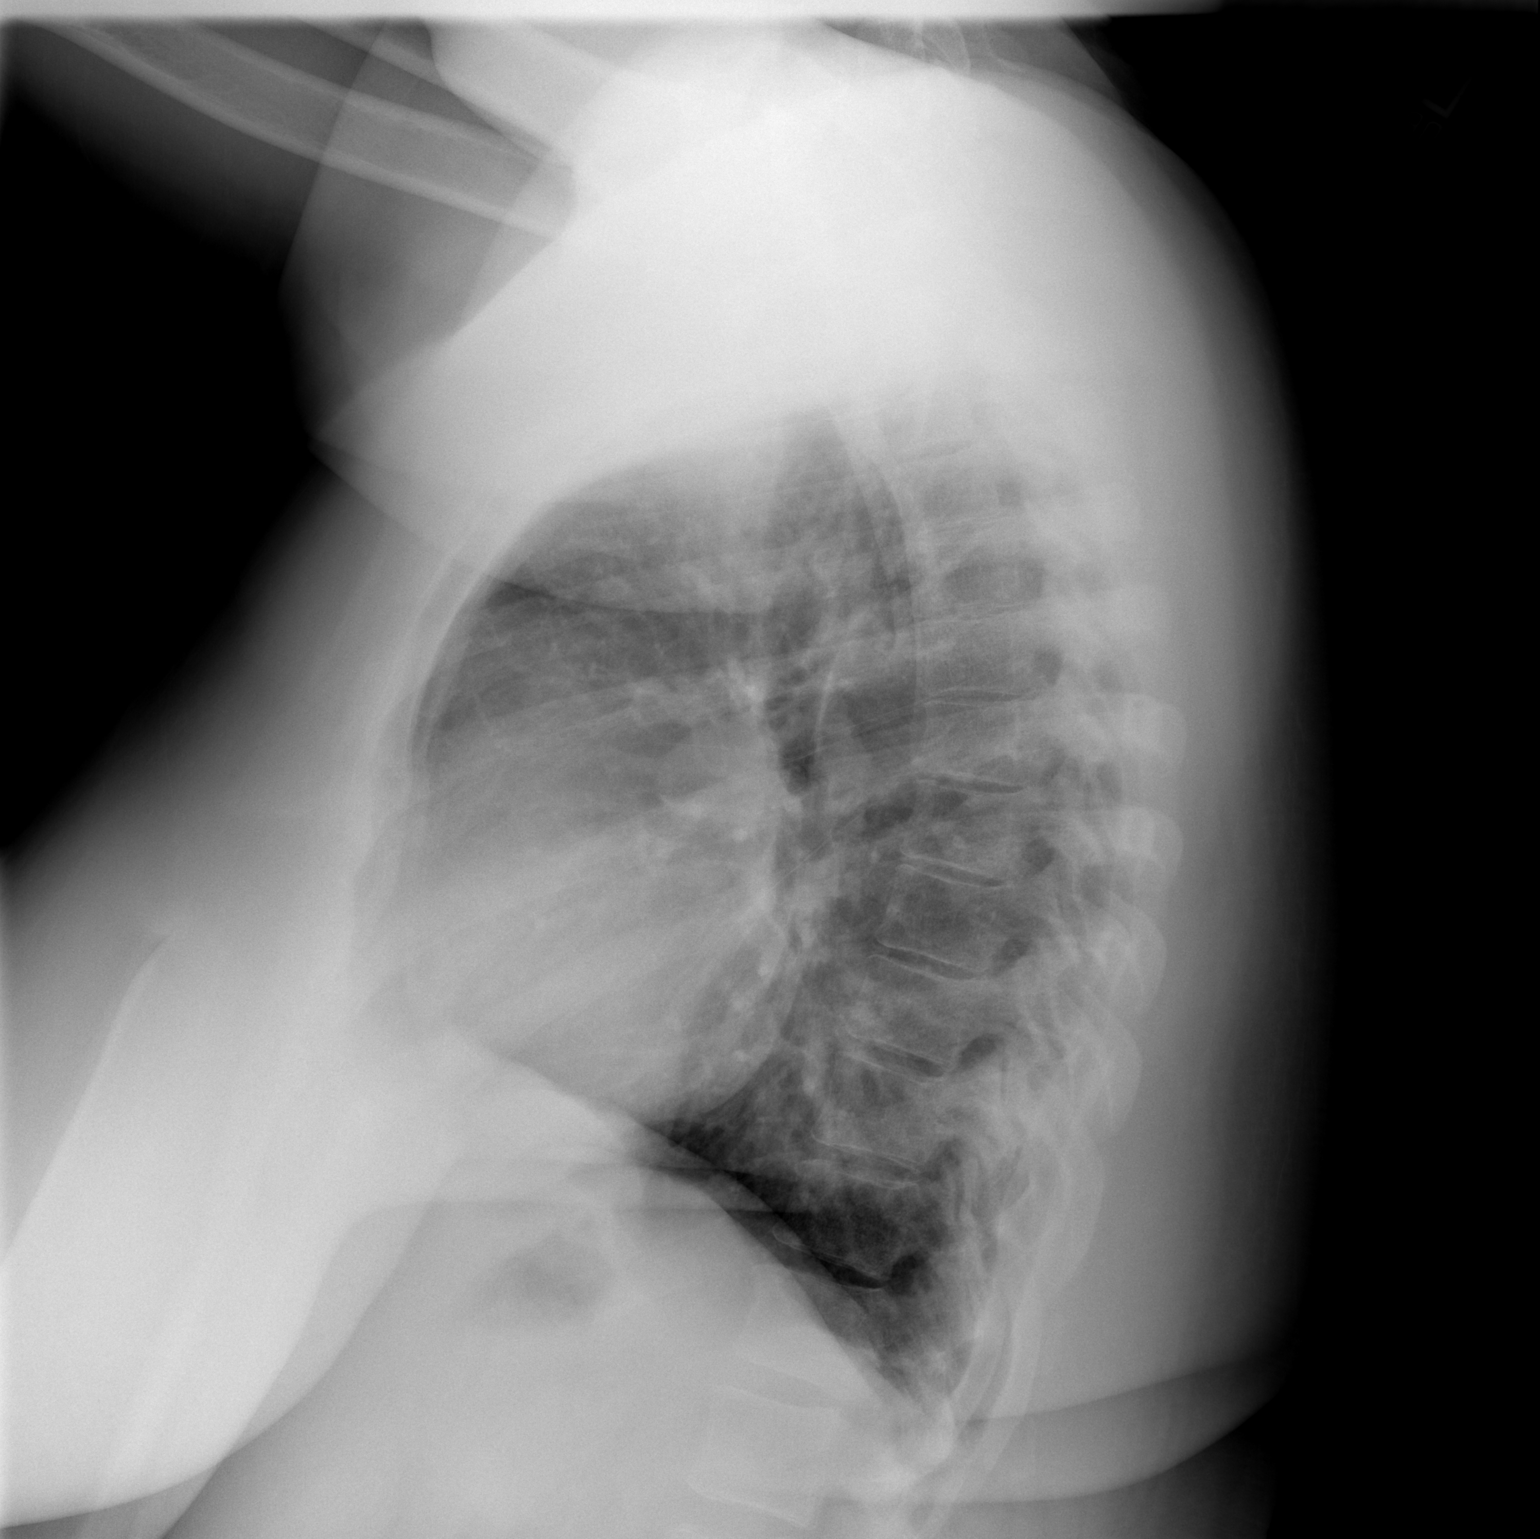

[2 of 2 positions shown; findings below may reference images not displayed]

FINDINGS: Regression with lesser slight cardiomegaly seen.  Lungs
are clear.  Mediastinum, hila, pleura and osseous structures appear
normal for age.
IMPRESSION: 1.  Regression with persistent slight cardiomegaly.
2.  No active cardiopulmonary disease.

## 2012-10-25 DIAGNOSIS — R7301 Impaired fasting glucose: Secondary | ICD-10-CM | POA: Diagnosis not present

## 2012-10-25 DIAGNOSIS — I10 Essential (primary) hypertension: Secondary | ICD-10-CM | POA: Diagnosis not present

## 2012-10-25 DIAGNOSIS — J45909 Unspecified asthma, uncomplicated: Secondary | ICD-10-CM | POA: Diagnosis not present

## 2012-10-25 DIAGNOSIS — E559 Vitamin D deficiency, unspecified: Secondary | ICD-10-CM | POA: Diagnosis not present

## 2012-11-08 DIAGNOSIS — E1142 Type 2 diabetes mellitus with diabetic polyneuropathy: Secondary | ICD-10-CM | POA: Diagnosis not present

## 2012-11-08 DIAGNOSIS — E119 Type 2 diabetes mellitus without complications: Secondary | ICD-10-CM | POA: Diagnosis not present

## 2012-11-22 DIAGNOSIS — J45909 Unspecified asthma, uncomplicated: Secondary | ICD-10-CM | POA: Diagnosis not present

## 2012-12-11 DIAGNOSIS — E119 Type 2 diabetes mellitus without complications: Secondary | ICD-10-CM | POA: Diagnosis not present

## 2013-01-03 DIAGNOSIS — E559 Vitamin D deficiency, unspecified: Secondary | ICD-10-CM | POA: Diagnosis not present

## 2013-01-03 DIAGNOSIS — E1159 Type 2 diabetes mellitus with other circulatory complications: Secondary | ICD-10-CM | POA: Diagnosis not present

## 2013-01-03 DIAGNOSIS — E119 Type 2 diabetes mellitus without complications: Secondary | ICD-10-CM | POA: Diagnosis not present

## 2013-01-03 DIAGNOSIS — I10 Essential (primary) hypertension: Secondary | ICD-10-CM | POA: Diagnosis not present

## 2013-04-15 DIAGNOSIS — J301 Allergic rhinitis due to pollen: Secondary | ICD-10-CM | POA: Diagnosis not present

## 2013-04-15 DIAGNOSIS — J3089 Other allergic rhinitis: Secondary | ICD-10-CM | POA: Diagnosis not present

## 2013-04-18 DIAGNOSIS — J3089 Other allergic rhinitis: Secondary | ICD-10-CM | POA: Diagnosis not present

## 2013-04-18 DIAGNOSIS — I10 Essential (primary) hypertension: Secondary | ICD-10-CM | POA: Diagnosis not present

## 2013-04-18 DIAGNOSIS — E1142 Type 2 diabetes mellitus with diabetic polyneuropathy: Secondary | ICD-10-CM | POA: Diagnosis not present

## 2013-04-18 DIAGNOSIS — J301 Allergic rhinitis due to pollen: Secondary | ICD-10-CM | POA: Diagnosis not present

## 2013-04-18 DIAGNOSIS — E119 Type 2 diabetes mellitus without complications: Secondary | ICD-10-CM | POA: Diagnosis not present

## 2013-04-18 DIAGNOSIS — K122 Cellulitis and abscess of mouth: Secondary | ICD-10-CM | POA: Diagnosis not present

## 2013-04-18 DIAGNOSIS — J45909 Unspecified asthma, uncomplicated: Secondary | ICD-10-CM | POA: Diagnosis not present

## 2013-04-18 DIAGNOSIS — K219 Gastro-esophageal reflux disease without esophagitis: Secondary | ICD-10-CM | POA: Diagnosis not present

## 2013-05-15 DIAGNOSIS — J301 Allergic rhinitis due to pollen: Secondary | ICD-10-CM | POA: Diagnosis not present

## 2013-05-15 DIAGNOSIS — J3089 Other allergic rhinitis: Secondary | ICD-10-CM | POA: Diagnosis not present

## 2013-05-31 DIAGNOSIS — I10 Essential (primary) hypertension: Secondary | ICD-10-CM | POA: Diagnosis not present

## 2013-05-31 DIAGNOSIS — K219 Gastro-esophageal reflux disease without esophagitis: Secondary | ICD-10-CM | POA: Diagnosis not present

## 2013-05-31 DIAGNOSIS — J45909 Unspecified asthma, uncomplicated: Secondary | ICD-10-CM | POA: Diagnosis not present

## 2013-05-31 DIAGNOSIS — E1142 Type 2 diabetes mellitus with diabetic polyneuropathy: Secondary | ICD-10-CM | POA: Diagnosis not present

## 2013-05-31 DIAGNOSIS — J3089 Other allergic rhinitis: Secondary | ICD-10-CM | POA: Diagnosis not present

## 2013-05-31 DIAGNOSIS — E119 Type 2 diabetes mellitus without complications: Secondary | ICD-10-CM | POA: Diagnosis not present

## 2013-05-31 DIAGNOSIS — J301 Allergic rhinitis due to pollen: Secondary | ICD-10-CM | POA: Diagnosis not present

## 2013-09-23 DIAGNOSIS — E119 Type 2 diabetes mellitus without complications: Secondary | ICD-10-CM | POA: Diagnosis not present

## 2013-09-23 DIAGNOSIS — J3089 Other allergic rhinitis: Secondary | ICD-10-CM | POA: Diagnosis not present

## 2013-09-23 DIAGNOSIS — Z79899 Other long term (current) drug therapy: Secondary | ICD-10-CM | POA: Diagnosis not present

## 2013-09-23 DIAGNOSIS — J301 Allergic rhinitis due to pollen: Secondary | ICD-10-CM | POA: Diagnosis not present

## 2013-09-23 DIAGNOSIS — R5383 Other fatigue: Secondary | ICD-10-CM | POA: Diagnosis not present

## 2013-09-23 DIAGNOSIS — J45909 Unspecified asthma, uncomplicated: Secondary | ICD-10-CM | POA: Diagnosis not present

## 2013-09-23 DIAGNOSIS — K219 Gastro-esophageal reflux disease without esophagitis: Secondary | ICD-10-CM | POA: Diagnosis not present

## 2013-09-23 DIAGNOSIS — Z7901 Long term (current) use of anticoagulants: Secondary | ICD-10-CM | POA: Diagnosis not present

## 2013-09-23 DIAGNOSIS — I10 Essential (primary) hypertension: Secondary | ICD-10-CM | POA: Diagnosis not present

## 2013-09-23 DIAGNOSIS — R059 Cough, unspecified: Secondary | ICD-10-CM | POA: Diagnosis not present

## 2013-09-23 DIAGNOSIS — Z5181 Encounter for therapeutic drug level monitoring: Secondary | ICD-10-CM | POA: Diagnosis not present

## 2013-09-23 DIAGNOSIS — E1142 Type 2 diabetes mellitus with diabetic polyneuropathy: Secondary | ICD-10-CM | POA: Diagnosis not present

## 2013-10-14 DIAGNOSIS — H539 Unspecified visual disturbance: Secondary | ICD-10-CM | POA: Diagnosis not present

## 2013-10-14 DIAGNOSIS — K219 Gastro-esophageal reflux disease without esophagitis: Secondary | ICD-10-CM | POA: Diagnosis not present

## 2013-10-14 DIAGNOSIS — I10 Essential (primary) hypertension: Secondary | ICD-10-CM | POA: Diagnosis not present

## 2013-10-14 DIAGNOSIS — R059 Cough, unspecified: Secondary | ICD-10-CM | POA: Diagnosis not present

## 2013-10-14 DIAGNOSIS — J45909 Unspecified asthma, uncomplicated: Secondary | ICD-10-CM | POA: Diagnosis not present

## 2013-10-14 DIAGNOSIS — J3089 Other allergic rhinitis: Secondary | ICD-10-CM | POA: Diagnosis not present

## 2013-10-14 DIAGNOSIS — E119 Type 2 diabetes mellitus without complications: Secondary | ICD-10-CM | POA: Diagnosis not present

## 2013-10-14 DIAGNOSIS — J301 Allergic rhinitis due to pollen: Secondary | ICD-10-CM | POA: Diagnosis not present

## 2013-10-31 DIAGNOSIS — J3089 Other allergic rhinitis: Secondary | ICD-10-CM | POA: Diagnosis not present

## 2013-12-24 DIAGNOSIS — Z23 Encounter for immunization: Secondary | ICD-10-CM | POA: Diagnosis not present

## 2013-12-24 DIAGNOSIS — I10 Essential (primary) hypertension: Secondary | ICD-10-CM | POA: Diagnosis not present

## 2013-12-24 DIAGNOSIS — E119 Type 2 diabetes mellitus without complications: Secondary | ICD-10-CM | POA: Diagnosis not present

## 2014-10-01 DIAGNOSIS — I10 Essential (primary) hypertension: Secondary | ICD-10-CM | POA: Diagnosis not present

## 2014-10-01 DIAGNOSIS — Z1389 Encounter for screening for other disorder: Secondary | ICD-10-CM | POA: Diagnosis not present

## 2014-10-01 DIAGNOSIS — K219 Gastro-esophageal reflux disease without esophagitis: Secondary | ICD-10-CM | POA: Diagnosis not present

## 2014-10-01 DIAGNOSIS — N390 Urinary tract infection, site not specified: Secondary | ICD-10-CM | POA: Diagnosis not present

## 2014-10-01 DIAGNOSIS — E119 Type 2 diabetes mellitus without complications: Secondary | ICD-10-CM | POA: Diagnosis not present

## 2014-10-01 DIAGNOSIS — Z01118 Encounter for examination of ears and hearing with other abnormal findings: Secondary | ICD-10-CM | POA: Diagnosis not present

## 2014-10-01 DIAGNOSIS — J45909 Unspecified asthma, uncomplicated: Secondary | ICD-10-CM | POA: Diagnosis not present

## 2014-10-01 DIAGNOSIS — Z0100 Encounter for examination of eyes and vision without abnormal findings: Secondary | ICD-10-CM | POA: Diagnosis not present

## 2014-10-01 DIAGNOSIS — Z0000 Encounter for general adult medical examination without abnormal findings: Secondary | ICD-10-CM | POA: Diagnosis not present

## 2014-10-01 DIAGNOSIS — Z136 Encounter for screening for cardiovascular disorders: Secondary | ICD-10-CM | POA: Diagnosis not present

## 2014-10-01 DIAGNOSIS — J3089 Other allergic rhinitis: Secondary | ICD-10-CM | POA: Diagnosis not present

## 2014-11-01 ENCOUNTER — Emergency Department (HOSPITAL_COMMUNITY)
Admission: EM | Admit: 2014-11-01 | Discharge: 2014-11-01 | Disposition: A | Discharge status: Home / Self Care | Payer: Medicare Other | Attending: Emergency Medicine | Admitting: Emergency Medicine

## 2014-11-01 ENCOUNTER — Encounter (HOSPITAL_COMMUNITY): Payer: Self-pay | Admitting: Emergency Medicine

## 2014-11-01 DIAGNOSIS — Z3202 Encounter for pregnancy test, result negative: Secondary | ICD-10-CM | POA: Insufficient documentation

## 2014-11-01 DIAGNOSIS — R112 Nausea with vomiting, unspecified: Secondary | ICD-10-CM | POA: Diagnosis not present

## 2014-11-01 DIAGNOSIS — R739 Hyperglycemia, unspecified: Secondary | ICD-10-CM | POA: Diagnosis not present

## 2014-11-01 DIAGNOSIS — I10 Essential (primary) hypertension: Secondary | ICD-10-CM | POA: Diagnosis not present

## 2014-11-01 DIAGNOSIS — R42 Dizziness and giddiness: Secondary | ICD-10-CM

## 2014-11-01 DIAGNOSIS — Z792 Long term (current) use of antibiotics: Secondary | ICD-10-CM | POA: Insufficient documentation

## 2014-11-01 DIAGNOSIS — J45909 Unspecified asthma, uncomplicated: Secondary | ICD-10-CM | POA: Insufficient documentation

## 2014-11-01 LAB — CBC WITH DIFFERENTIAL/PLATELET
BASOS ABS: 0 10*3/uL (ref 0.0–0.1)
BASOS PCT: 0 % (ref 0–1)
Eosinophils Absolute: 0.1 10*3/uL (ref 0.0–0.7)
Eosinophils Relative: 1 % (ref 0–5)
HEMATOCRIT: 39.2 % (ref 36.0–46.0)
HEMOGLOBIN: 12.8 g/dL (ref 12.0–15.0)
Lymphocytes Relative: 21 % (ref 12–46)
Lymphs Abs: 1.5 10*3/uL (ref 0.7–4.0)
MCH: 29.4 pg (ref 26.0–34.0)
MCHC: 32.7 g/dL (ref 30.0–36.0)
MCV: 90.1 fL (ref 78.0–100.0)
Monocytes Absolute: 0.7 10*3/uL (ref 0.1–1.0)
Monocytes Relative: 10 % (ref 3–12)
NEUTROS ABS: 4.8 10*3/uL (ref 1.7–7.7)
NEUTROS PCT: 68 % (ref 43–77)
Platelets: 231 10*3/uL (ref 150–400)
RBC: 4.35 MIL/uL (ref 3.87–5.11)
RDW: 13.6 % (ref 11.5–15.5)
WBC: 7.1 10*3/uL (ref 4.0–10.5)

## 2014-11-01 LAB — BASIC METABOLIC PANEL
ANION GAP: 7 (ref 5–15)
BUN: 9 mg/dL (ref 6–20)
CHLORIDE: 106 mmol/L (ref 101–111)
CO2: 27 mmol/L (ref 22–32)
Calcium: 9.6 mg/dL (ref 8.9–10.3)
Creatinine, Ser: 0.69 mg/dL (ref 0.44–1.00)
GFR calc non Af Amer: >60 mL/min (ref >60)
Glucose, Bld: 154 mg/dL — ABNORMAL HIGH (ref 65–99)
Potassium: 4 mmol/L (ref 3.5–5.1)
Sodium: 140 mmol/L (ref 135–145)

## 2014-11-01 LAB — I-STAT TROPONIN, ED: TROPONIN I, POC: 0 ng/mL (ref 0.00–0.08)

## 2014-11-01 LAB — I-STAT BETA HCG BLOOD, ED (MC, WL, AP ONLY)

## 2014-11-01 MED ORDER — SODIUM CHLORIDE 0.9 % IV BOLUS (SEPSIS)
1000.0000 mL | Freq: Once | INTRAVENOUS | Status: AC
Start: 2014-11-01 — End: 2014-11-01
  Administered 2014-11-01: 1000 mL via INTRAVENOUS

## 2014-11-01 MED ORDER — MECLIZINE HCL 25 MG PO TABS: 25.0000 mg | ORAL_TABLET | Freq: Three times a day (TID) | ORAL | Status: DC | PRN

## 2014-11-01 MED ORDER — ONDANSETRON HCL 8 MG PO TABS: 8.0000 mg | ORAL_TABLET | Freq: Three times a day (TID) | ORAL | Status: DC | PRN

## 2014-11-01 MED ORDER — ONDANSETRON HCL 4 MG/2ML IJ SOLN
4.0000 mg | Freq: Once | INTRAMUSCULAR | Status: AC
  Administered 2014-11-01: 4 mg via INTRAVENOUS
  Filled 2014-11-01: qty 2

## 2014-11-01 NOTE — ED Provider Notes (Signed)
CSN: [id number]     Arrival date & time 11/01/14  1634 History   First MD Initiated Contact with Patient 11/01/14 1706     Chief Complaint  Patient presents with  . Dizziness  . Emesis     (Consider location/radiation/quality/duration/timing/severity/associated sxs/prior Treatment) HPI Comments: Angela Daugherty is a 44 y.o. female with a PMHx of HTN and asthma, who presents to the ED with complaints of lightheadedness with standing that occurred just prior to arrival when she stood up from laying sideways on her bed while she was talking on the phone. She described this as a lightheaded feeling, no spinning sensation. She reports that she vomited once which was nonbloody and nonbilious, and has had some mild nausea whenever she stands up. Overall she feels somewhat improved, but she still has a lightheadedness sensation when she stands. She denies any headache, vision changes, ear pain or drainage, hearing loss, tinnitus, fevers, chills, chest pain, shortness of breath, abdominal pain, diarrhea, constipation, melena, hematochezia, hematemesis, dysuria, hematuria, numbness, tingling, weakness, or syncope. She denies history of similar symptoms, no changes in medications recently. Her PCP is Dr. Caryl Pina in [facility].  Patient is a 44 y.o. female presenting with dizziness and vomiting. The history is provided by the patient. No language interpreter was used.  Dizziness Quality:  Lightheadedness Severity:  Mild Onset quality:  Gradual Duration:  3 hours Timing:  Sporadic Progression:  Improving Chronicity:  New Context: standing up   Context: not with ear pain   Relieved by:  Change in position Worsened by:  Standing up Ineffective treatments:  None tried Associated symptoms: nausea and vomiting (x1)   Associated symptoms: no blood in stool, no chest pain, no diarrhea, no headaches, no hearing loss, no shortness of breath, no syncope, no tinnitus, no vision changes and no weakness   Risk  factors: no hx of stroke, no hx of vertigo, no multiple medications and no new medications   Emesis Associated symptoms: no abdominal pain, no arthralgias, no chills, no diarrhea, no headaches and no myalgias     Past Medical History  Diagnosis Date  . Hypertension   . Asthma    History reviewed. No pertinent past surgical history. No family history on file. Social History  Substance Use Topics  . Smoking status: Never Smoker   . Smokeless tobacco: None  . Alcohol Use: No   OB History    No data available     Review of Systems  Constitutional: Negative for fever and chills.  HENT: Negative for ear discharge, ear pain, hearing loss and tinnitus.   Eyes: Negative for visual disturbance.  Respiratory: Negative for shortness of breath.   Cardiovascular: Negative for chest pain and syncope.  Gastrointestinal: Positive for nausea and vomiting (x1). Negative for abdominal pain, diarrhea, constipation and blood in stool.  Genitourinary: Negative for dysuria and hematuria.  Musculoskeletal: Negative for myalgias, back pain, arthralgias and neck pain.  Skin: Negative for color change.  Allergic/Immunologic: Negative for immunocompromised state.  Neurological: Positive for dizziness and light-headedness. Negative for syncope, weakness, numbness and headaches.  Psychiatric/Behavioral: Negative for confusion.   10 Systems reviewed and are negative for acute change except as noted in the HPI.    Allergies  Review of patient's allergies indicates no known allergies.  Home Medications   Prior to Admission medications   Medication Sig Start Date End Date Taking? Authorizing Provider  cephALEXin (KEFLEX) 500 MG capsule Take 1 capsule (500 mg total) by mouth 4 (four) times  daily. 07/20/12   Cleatrice Burke, PA-C  HYDROcodone-acetaminophen (NORCO/VICODIN) 5-325 MG per tablet Take 2 tablets by mouth every 6 (six) hours as needed for pain. 07/20/12   Cleatrice Burke, PA-C   sulfamethoxazole-trimethoprim (SEPTRA DS) 800-160 MG per tablet Take 1 tablet by mouth every 12 (twelve) hours. 07/20/12   Cleatrice Burke, PA-C   BP 141/82 mmHg  Pulse 75  Temp(Src) 98 F (36.7 C) (Oral)  Resp 18  SpO2 97% Physical Exam  Constitutional: She is oriented to person, place, and time. Vital signs are normal. She appears well-developed and well-nourished.  Non-toxic appearance. No distress.  Afebrile, nontoxic, NAD  HENT:  Head: Normocephalic and atraumatic.  Mouth/Throat: Oropharynx is clear and moist and mucous membranes are normal.  Eyes: Conjunctivae and EOM are normal. Pupils are equal, round, and reactive to light. Right eye exhibits no discharge. Left eye exhibits no discharge.  PERRL, EOMI, no nystagmus, no visual field deficits   Neck: Normal range of motion. Neck supple. No spinous process tenderness and no muscular tenderness present. No rigidity. Normal range of motion present.  Cardiovascular: Normal rate, regular rhythm, normal heart sounds and intact distal pulses.  Exam reveals no gallop and no friction rub.   No murmur heard. Pulmonary/Chest: Effort normal and breath sounds normal. No respiratory distress. She has no decreased breath sounds. She has no wheezes. She has no rhonchi. She has no rales.  Abdominal: Soft. Normal appearance and bowel sounds are normal. She exhibits no distension. There is no tenderness. There is no rigidity, no rebound and no guarding.  Musculoskeletal: Normal range of motion.  MAE x4 Strength and sensation grossly intact Distal pulses intact Gait steady  Neurological: She is alert and oriented to person, place, and time. She has normal strength. No cranial nerve deficit or sensory deficit. Coordination and gait normal. GCS eye subscore is 4. GCS verbal subscore is 5. GCS motor subscore is 6.  CN 2-12 grossly intact A&O x4 GCS 15 Sensation and strength intact Gait nonataxic including with tandem walking Coordination with  finger-to-nose WNL Neg pronator drift   Skin: Skin is warm, dry and intact. No rash noted.  Psychiatric: She has a normal mood and affect.  Nursing note and vitals reviewed.   ED Course  Procedures (including critical care time) 17:36 Orthostatic Vital Signs RC  Orthostatic Lying  - BP- Lying: 135/70 mmHg ; Pulse- Lying: 65  Orthostatic Sitting - BP- Sitting: 131/77 mmHg ; Pulse- Sitting: 75  Orthostatic Standing at 0 minutes - BP- Standing at 0 minutes: 130/69 mmHg ; Pulse- Standing at 0 minutes: 81       Labs Review Labs Reviewed  BASIC METABOLIC PANEL - Abnormal; Notable for the following:    Glucose, Bld 154 (*)    All other components within normal limits  CBC WITH DIFFERENTIAL/PLATELET  I-STAT BETA HCG BLOOD, ED ([facility], [facility], [facility] ONLY)  I-STAT TROPOININ, ED    Imaging Review No results found. I have personally reviewed and evaluated these images and lab results as part of my medical decision-making.   EKG Interpretation   Date/Time:  Saturday November 01 2014 17:44:26 EDT Ventricular Rate:  66 PR Interval:  157 QRS Duration: 90 QT Interval:  391 QTC Calculation: 410 R Axis:   4 Text Interpretation:  Sinus rhythm Borderline T abnormalities, anterior  leads No old tracing to compare Abnormal ekg Confirmed by Sabra Heck  MD,  BRIAN ([id number]) on 11/01/2014 5:52:50 PM      MDM   Final diagnoses:  Dizziness  Hyperglycemia  Non-intractable vomiting with nausea, vomiting of unspecified type    44 y.o. female here with lightheadedness with standing. Denies vertigo, HA, vision changes. No syncope. Only occurs when she stands up. Had one episode of vomiting after it started, none since then. Overall feels improved. Will obtain basic labs, EKG, and orthostatic VS. Doubt need for head CT, nonfocal neuro exam. Will give fluids and nausea meds. Will reassess shortly.   7:00 PM Orthostatic VS negative. Beta HCG neg. Trop neg. EKG with some borderline T abnormalities but no prior EKG  to compare. CBC and BMP WNL aside from mildly elevated glucose at 154. Pt has not yet received zofran or fluids, will have these given now.  8:07 PM Pt feeling improved, tolerating PO. Will d/c home with meclizine and zofran. F/up with PCP in 1wk. I explained the diagnosis and have given explicit precautions to return to the ER including for any other new or worsening symptoms. The patient understands and accepts the medical plan as it's been dictated and I have answered their questions. Discharge instructions concerning home care and prescriptions have been given. The patient is STABLE and is discharged to home in good condition.  BP 130/69 mmHg  Pulse 81  Temp(Src) 98 F (36.7 C) (Oral)  Resp 18  Ht 5\' 1"  (1.549 m)  Wt 217 lb (98.431 kg)  BMI 41.02 kg/m2  SpO2 100%  Meds ordered this encounter  Medications  . sodium chloride 0.9 % bolus 1,000 mL    Sig:   . ondansetron (ZOFRAN) injection 4 mg    Sig:   . ondansetron (ZOFRAN) 8 MG tablet    Sig: Take 1 tablet (8 mg total) by mouth every 8 (eight) hours as needed for nausea or vomiting.    Dispense:  10 tablet    Refill:  0    Order Specific Question:  Supervising Provider    Answer:  Sabra Heck, BRIAN [[id number]]  . meclizine (ANTIVERT) 25 MG tablet    Sig: Take 1 tablet (25 mg total) by mouth 3 (three) times daily as needed for dizziness or nausea.    Dispense:  30 tablet    Refill:  0    Order Specific Question:  Supervising Provider    Answer:  Noemi Chapel [[id number]]     Naraly Fritcher Camprubi-Soms, PA-C 11/01/14 2007  Noemi Chapel, MD 11/02/14 [phone]

## 2014-11-01 NOTE — Discharge Instructions (Signed)
Use zofran and meclizine as directed, as needed for nausea and dizziness. Stay well hydrated. Follow up with your regular doctor in 1 week. Return to the ER for changes or worsening symptoms.   Dizziness Dizziness is a common problem. It is a feeling of unsteadiness or light-headedness. You may feel like you are about to faint. Dizziness can lead to injury if you stumble or fall. A person of any age group can suffer from dizziness, but dizziness is more common in older adults. CAUSES  Dizziness can be caused by many different things, including:  Middle ear problems.  Standing for too long.  Infections.  An allergic reaction.  Aging.  An emotional response to something, such as the sight of blood.  Side effects of medicines.  Tiredness.  Problems with circulation or blood pressure.  Excessive use of alcohol or medicines, or illegal drug use.  Breathing too fast (hyperventilation).  An irregular heart rhythm (arrhythmia).  A low red blood cell count (anemia).  Pregnancy.  Vomiting, diarrhea, fever, or other illnesses that cause body fluid loss (dehydration).  Diseases or conditions such as Parkinson's disease, high blood pressure (hypertension), diabetes, and thyroid problems.  Exposure to extreme heat. DIAGNOSIS  Your health care provider will ask about your symptoms, perform a physical exam, and perform an electrocardiogram (ECG) to record the electrical activity of your heart. Your health care provider may also perform other heart or blood tests to determine the cause of your dizziness. These may include:  Transthoracic echocardiogram (TTE). During echocardiography, sound waves are used to evaluate how blood flows through your heart.  Transesophageal echocardiogram (TEE).  Cardiac monitoring. This allows your health care provider to monitor your heart rate and rhythm in real time.  Holter monitor. This is a portable device that records your heartbeat and can help  diagnose heart arrhythmias. It allows your health care provider to track your heart activity for several days if needed.  Stress tests by exercise or by giving medicine that makes the heart beat faster. TREATMENT  Treatment of dizziness depends on the cause of your symptoms and can vary greatly. HOME CARE INSTRUCTIONS   Drink enough fluids to keep your urine clear or pale yellow. This is especially important in very hot weather. In older adults, it is also important in cold weather.  Take your medicine exactly as directed if your dizziness is caused by medicines. When taking blood pressure medicines, it is especially important to get up slowly.  Rise slowly from chairs and steady yourself until you feel okay.  In the morning, first sit up on the side of the bed. When you feel okay, stand slowly while holding onto something until you know your balance is fine.  Move your legs often if you need to stand in one place for a long time. Tighten and relax your muscles in your legs while standing.  Have someone stay with you for 1-2 days if dizziness continues to be a problem. Do this until you feel you are well enough to stay alone. Have the person call your health care provider if he or she notices changes in you that are concerning.  Do not drive or use heavy machinery if you feel dizzy.  Do not drink alcohol. SEEK IMMEDIATE MEDICAL CARE IF:   Your dizziness or light-headedness gets worse.  You feel nauseous or vomit.  You have problems talking, walking, or using your arms, hands, or legs.  You feel weak.  You are not thinking clearly or  you have trouble forming sentences. It may take a friend or family member to notice this.  You have chest pain, abdominal pain, shortness of breath, or sweating.  Your vision changes.  You notice any bleeding.  You have side effects from medicine that seems to be getting worse rather than better. MAKE SURE YOU:   Understand these  instructions.  Will watch your condition.  Will get help right away if you are not doing well or get worse. Document Released: 08/17/2000 Document Revised: 02/26/2013 Document Reviewed: 09/10/2010 [facility] Patient Information 2015 [place], [place]. This information is not intended to replace advice given to you by your health care provider. Make sure you discuss any questions you have with your health care provider.  Benign Positional Vertigo Vertigo means you feel like you or your surroundings are moving when they are not. Benign positional vertigo is the most common form of vertigo. Benign means that the cause of your condition is not serious. Benign positional vertigo is more common in older adults. CAUSES  Benign positional vertigo is the result of an upset in the labyrinth system. This is an area in the middle ear that helps control your balance. This may be caused by a viral infection, head injury, or repetitive motion. However, often no specific cause is found. SYMPTOMS  Symptoms of benign positional vertigo occur when you move your head or eyes in different directions. Some of the symptoms may include:  Loss of balance and falls.  Vomiting.  Blurred vision.  Dizziness.  Nausea.  Involuntary eye movements (nystagmus). DIAGNOSIS  Benign positional vertigo is usually diagnosed by physical exam. If the specific cause of your benign positional vertigo is unknown, your caregiver may perform imaging tests, such as magnetic resonance imaging (MRI) or computed tomography (CT). TREATMENT  Your caregiver may recommend movements or procedures to correct the benign positional vertigo. Medicines such as meclizine, benzodiazepines, and medicines for nausea may be used to treat your symptoms. In rare cases, if your symptoms are caused by certain conditions that affect the inner ear, you may need surgery. HOME CARE INSTRUCTIONS   Follow your caregiver's instructions.  Move slowly. Do not make  sudden body or head movements.  Avoid driving.  Avoid operating heavy machinery.  Avoid performing any tasks that would be dangerous to you or others during a vertigo episode.  Drink enough fluids to keep your urine clear or pale yellow. SEEK IMMEDIATE MEDICAL CARE IF:   You develop problems with walking, weakness, numbness, or using your arms, hands, or legs.  You have difficulty speaking.  You develop severe headaches.  Your nausea or vomiting continues or gets worse.  You develop visual changes.  Your family or friends notice any behavioral changes.  Your condition gets worse.  You have a fever.  You develop a stiff neck or sensitivity to light. MAKE SURE YOU:   Understand these instructions.  Will watch your condition.  Will get help right away if you are not doing well or get worse. Document Released: 11/29/2005 Document Revised: 05/16/2011 Document Reviewed: 11/11/2010 [facility] Patient Information 2015 [place], [place]. This information is not intended to replace advice given to you by your health care provider. Make sure you discuss any questions you have with your health care provider.  Epley Maneuver Self-Care WHAT IS THE EPLEY MANEUVER? The Epley maneuver is an exercise you can do to relieve symptoms of benign paroxysmal positional vertigo (BPPV). This condition is often just referred to as vertigo. BPPV is caused by  the movement of tiny crystals (canaliths) inside your inner ear. The accumulation and movement of canaliths in your inner ear causes a sudden spinning sensation (vertigo) when you move your head to certain positions. Vertigo usually lasts about 30 seconds. BPPV usually occurs in just one ear. If you get vertigo when you lie on your left side, you probably have BPPV in your left ear. Your health care provider can tell you which ear is involved.  BPPV may be caused by a head injury. Many people older than 50 get BPPV for unknown reasons. If you have been  diagnosed with BPPV, your health care provider may teach you how to do this maneuver. BPPV is not life threatening (benign) and usually goes away in time.  WHEN SHOULD I PERFORM THE EPLEY MANEUVER? You can do this maneuver at home whenever you have symptoms of vertigo. You may do the Epley maneuver up to 3 times a day until your symptoms of vertigo go away. HOW SHOULD I DO THE EPLEY MANEUVER?  Sit on the edge of a bed or table with your back straight. Your legs should be extended or hanging over the edge of the bed or table.   Turn your head halfway toward the affected ear.   Lie backward quickly with your head turned until you are lying flat on your back. You may want to position a pillow under your shoulders.   Hold this position for 30 seconds. You may experience an attack of vertigo. This is normal. Hold this position until the vertigo stops.  Then turn your head to the opposite direction until your unaffected ear is facing the floor.   Hold this position for 30 seconds. You may experience an attack of vertigo. This is normal. Hold this position until the vertigo stops.  Now turn your whole body to the same side as your head. Hold for another 30 seconds.   You can then sit back up. ARE THERE RISKS TO THIS MANEUVER? In some cases, you may have other symptoms (such as changes in your vision, weakness, or numbness). If you have these symptoms, stop doing the maneuver and call your health care provider. Even if doing these maneuvers relieves your vertigo, you may still have dizziness. Dizziness is the sensation of light-headedness but without the sensation of movement. Even though the Epley maneuver may relieve your vertigo, it is possible that your symptoms will return within 5 years. WHAT SHOULD I DO AFTER THIS MANEUVER? After doing the Epley maneuver, you can return to your normal activities. Ask your doctor if there is anything you should do at home to prevent vertigo. This may  include:  Sleeping with two or more pillows to keep your head elevated.  Not sleeping on the side of your affected ear.  Getting up slowly from bed.  Avoiding sudden movements during the day.  Avoiding extreme head movement, like looking up or bending over.  Wearing a cervical collar to prevent sudden head movements. WHAT SHOULD I DO IF MY SYMPTOMS GET WORSE? Call your health care provider if your vertigo gets worse. Call your provider right way if you have other symptoms, including:   Nausea.  Vomiting.  Headache.  Weakness.  Numbness.  Vision changes. Document Released: 02/26/2013 Document Reviewed: 02/26/2013 [facility] Patient Information 2015 [place], [place]. This information is not intended to replace advice given to you by your health care provider. Make sure you discuss any questions you have with your health care provider.  Nausea and Vomiting  Nausea means you feel sick to your stomach. Throwing up (vomiting) is a reflex where stomach contents come out of your mouth. HOME CARE   Take medicine as told by your doctor.  Do not force yourself to eat. However, you do need to drink fluids.  If you feel like eating, eat a normal diet as told by your doctor.  Eat rice, wheat, potatoes, bread, lean meats, yogurt, fruits, and vegetables.  Avoid high-fat foods.  Drink enough fluids to keep your pee (urine) clear or pale yellow.  Ask your doctor how to replace body fluid losses (rehydrate). Signs of body fluid loss (dehydration) include:  Feeling very thirsty.  Dry lips and mouth.  Feeling dizzy.  Dark pee.  Peeing less than normal.  Feeling confused.  Fast breathing or heart rate. GET HELP RIGHT AWAY IF:   You have blood in your throw up.  You have black or bloody poop (stool).  You have a bad headache or stiff neck.  You feel confused.  You have bad belly (abdominal) pain.  You have chest pain or trouble breathing.  You do not pee at least once  every 8 hours.  You have cold, clammy skin.  You keep throwing up after 24 to 48 hours.  You have a fever. MAKE SURE YOU:   Understand these instructions.  Will watch your condition.  Will get help right away if you are not doing well or get worse. Document Released: 08/10/2007 Document Revised: 05/16/2011 Document Reviewed: 07/23/2010 [facility] Patient Information 2015 [place], [place]. This information is not intended to replace advice given to you by your health care provider. Make sure you discuss any questions you have with your health care provider.

## 2014-11-01 NOTE — ED Notes (Signed)
Delay in bolus and zofran due to delay in IV

## 2014-11-01 NOTE — ED Notes (Signed)
Pt states that she was laying down sideways on her bed, "a way I normally don't lay", and when she tried to get up whiel talking on the phone she states that she got dizzy and vomited.  Pt states that she is only dizzy when she has positional changes.

## 2014-11-01 NOTE — ED Provider Notes (Signed)
44 year old female, presents with acute onset of dizziness associated with vomiting, she states that this felt like the room was spinning when she tried to stand up after being on the found on her bed. On exam the patient has no vertigo, no nystagmus, normal neurologic exam, normal cardiac exam, labs unremarkable, stable for discharge on medications such as meclizine and Zofran. The patient has been informed of the indications for return and is in agreement.  Meds given in ED:  Medications  sodium chloride 0.9 % bolus 1,000 mL (0 mLs Intravenous Stopped 11/01/14 2100)  ondansetron (ZOFRAN) injection 4 mg (4 mg Intravenous Given 11/01/14 1954)    Discharge Medication List as of 11/01/2014  8:06 PM    START taking these medications   Details  meclizine (ANTIVERT) 25 MG tablet Take 1 tablet (25 mg total) by mouth 3 (three) times daily as needed for dizziness or nausea., Starting 11/01/2014, Until Discontinued, Print    ondansetron (ZOFRAN) 8 MG tablet Take 1 tablet (8 mg total) by mouth every 8 (eight) hours as needed for nausea or vomiting., Starting 11/01/2014, Until Discontinued, Print        Medical screening examination/treatment/procedure(s) were conducted as a shared visit with non-physician practitioner(s) and myself.  I personally evaluated the patient during the encounter.  Clinical Impression:   Final diagnoses:  Dizziness  Hyperglycemia  Non-intractable vomiting with nausea, vomiting of unspecified type         Noemi Chapel, MD 11/02/14 [phone]

## 2014-11-11 DIAGNOSIS — J45909 Unspecified asthma, uncomplicated: Secondary | ICD-10-CM | POA: Diagnosis not present

## 2014-11-11 DIAGNOSIS — E119 Type 2 diabetes mellitus without complications: Secondary | ICD-10-CM | POA: Diagnosis not present

## 2014-11-11 DIAGNOSIS — K219 Gastro-esophageal reflux disease without esophagitis: Secondary | ICD-10-CM | POA: Diagnosis not present

## 2014-11-11 DIAGNOSIS — I10 Essential (primary) hypertension: Secondary | ICD-10-CM | POA: Diagnosis not present

## 2014-11-11 DIAGNOSIS — J3089 Other allergic rhinitis: Secondary | ICD-10-CM | POA: Diagnosis not present

## 2014-11-11 DIAGNOSIS — E1140 Type 2 diabetes mellitus with diabetic neuropathy, unspecified: Secondary | ICD-10-CM | POA: Diagnosis not present

## 2014-12-17 DIAGNOSIS — J3089 Other allergic rhinitis: Secondary | ICD-10-CM | POA: Diagnosis not present

## 2014-12-17 DIAGNOSIS — I10 Essential (primary) hypertension: Secondary | ICD-10-CM | POA: Diagnosis not present

## 2014-12-17 DIAGNOSIS — Z23 Encounter for immunization: Secondary | ICD-10-CM | POA: Diagnosis not present

## 2014-12-17 DIAGNOSIS — J45909 Unspecified asthma, uncomplicated: Secondary | ICD-10-CM | POA: Diagnosis not present

## 2014-12-17 DIAGNOSIS — K219 Gastro-esophageal reflux disease without esophagitis: Secondary | ICD-10-CM | POA: Diagnosis not present

## 2014-12-17 DIAGNOSIS — E119 Type 2 diabetes mellitus without complications: Secondary | ICD-10-CM | POA: Diagnosis not present

## 2014-12-17 DIAGNOSIS — E1140 Type 2 diabetes mellitus with diabetic neuropathy, unspecified: Secondary | ICD-10-CM | POA: Diagnosis not present

## 2015-03-20 DIAGNOSIS — I519 Heart disease, unspecified: Secondary | ICD-10-CM | POA: Diagnosis not present

## 2015-03-20 DIAGNOSIS — I10 Essential (primary) hypertension: Secondary | ICD-10-CM | POA: Diagnosis not present

## 2015-03-20 DIAGNOSIS — J3089 Other allergic rhinitis: Secondary | ICD-10-CM | POA: Diagnosis not present

## 2015-03-20 DIAGNOSIS — K219 Gastro-esophageal reflux disease without esophagitis: Secondary | ICD-10-CM | POA: Diagnosis not present

## 2015-03-20 DIAGNOSIS — J45909 Unspecified asthma, uncomplicated: Secondary | ICD-10-CM | POA: Diagnosis not present

## 2015-03-20 DIAGNOSIS — Z124 Encounter for screening for malignant neoplasm of cervix: Secondary | ICD-10-CM | POA: Diagnosis not present

## 2015-03-20 DIAGNOSIS — E1140 Type 2 diabetes mellitus with diabetic neuropathy, unspecified: Secondary | ICD-10-CM | POA: Diagnosis not present

## 2015-03-20 DIAGNOSIS — Z113 Encounter for screening for infections with a predominantly sexual mode of transmission: Secondary | ICD-10-CM | POA: Diagnosis not present

## 2015-03-20 DIAGNOSIS — R05 Cough: Secondary | ICD-10-CM | POA: Diagnosis not present

## 2015-03-20 DIAGNOSIS — E119 Type 2 diabetes mellitus without complications: Secondary | ICD-10-CM | POA: Diagnosis not present

## 2015-04-03 DIAGNOSIS — I10 Essential (primary) hypertension: Secondary | ICD-10-CM | POA: Diagnosis not present

## 2015-04-03 DIAGNOSIS — I517 Cardiomegaly: Secondary | ICD-10-CM | POA: Diagnosis not present

## 2015-04-17 DIAGNOSIS — J45909 Unspecified asthma, uncomplicated: Secondary | ICD-10-CM | POA: Diagnosis not present

## 2015-04-17 DIAGNOSIS — E119 Type 2 diabetes mellitus without complications: Secondary | ICD-10-CM | POA: Diagnosis not present

## 2015-04-17 DIAGNOSIS — R635 Abnormal weight gain: Secondary | ICD-10-CM | POA: Diagnosis not present

## 2015-04-17 DIAGNOSIS — K219 Gastro-esophageal reflux disease without esophagitis: Secondary | ICD-10-CM | POA: Diagnosis not present

## 2015-04-17 DIAGNOSIS — J3089 Other allergic rhinitis: Secondary | ICD-10-CM | POA: Diagnosis not present

## 2015-04-17 DIAGNOSIS — I10 Essential (primary) hypertension: Secondary | ICD-10-CM | POA: Diagnosis not present

## 2015-04-17 DIAGNOSIS — E1140 Type 2 diabetes mellitus with diabetic neuropathy, unspecified: Secondary | ICD-10-CM | POA: Diagnosis not present

## 2015-05-15 DIAGNOSIS — E1140 Type 2 diabetes mellitus with diabetic neuropathy, unspecified: Secondary | ICD-10-CM | POA: Diagnosis not present

## 2015-05-15 DIAGNOSIS — E119 Type 2 diabetes mellitus without complications: Secondary | ICD-10-CM | POA: Diagnosis not present

## 2015-05-15 DIAGNOSIS — K219 Gastro-esophageal reflux disease without esophagitis: Secondary | ICD-10-CM | POA: Diagnosis not present

## 2015-05-15 DIAGNOSIS — J45909 Unspecified asthma, uncomplicated: Secondary | ICD-10-CM | POA: Diagnosis not present

## 2015-05-15 DIAGNOSIS — I10 Essential (primary) hypertension: Secondary | ICD-10-CM | POA: Diagnosis not present

## 2015-05-15 DIAGNOSIS — R635 Abnormal weight gain: Secondary | ICD-10-CM | POA: Diagnosis not present

## 2015-05-15 DIAGNOSIS — J3089 Other allergic rhinitis: Secondary | ICD-10-CM | POA: Diagnosis not present

## 2015-06-03 DIAGNOSIS — K219 Gastro-esophageal reflux disease without esophagitis: Secondary | ICD-10-CM | POA: Diagnosis not present

## 2015-06-03 DIAGNOSIS — E119 Type 2 diabetes mellitus without complications: Secondary | ICD-10-CM | POA: Diagnosis not present

## 2015-06-03 DIAGNOSIS — I10 Essential (primary) hypertension: Secondary | ICD-10-CM | POA: Diagnosis not present

## 2015-06-03 DIAGNOSIS — R062 Wheezing: Secondary | ICD-10-CM | POA: Diagnosis not present

## 2015-06-03 DIAGNOSIS — J45909 Unspecified asthma, uncomplicated: Secondary | ICD-10-CM | POA: Diagnosis not present

## 2015-06-03 DIAGNOSIS — E1140 Type 2 diabetes mellitus with diabetic neuropathy, unspecified: Secondary | ICD-10-CM | POA: Diagnosis not present

## 2015-06-03 DIAGNOSIS — J3089 Other allergic rhinitis: Secondary | ICD-10-CM | POA: Diagnosis not present

## 2015-07-22 DIAGNOSIS — F329 Major depressive disorder, single episode, unspecified: Secondary | ICD-10-CM | POA: Diagnosis not present

## 2015-07-22 DIAGNOSIS — E119 Type 2 diabetes mellitus without complications: Secondary | ICD-10-CM | POA: Diagnosis not present

## 2015-07-22 DIAGNOSIS — Z01118 Encounter for examination of ears and hearing with other abnormal findings: Secondary | ICD-10-CM | POA: Diagnosis not present

## 2015-07-22 DIAGNOSIS — Z1389 Encounter for screening for other disorder: Secondary | ICD-10-CM | POA: Diagnosis not present

## 2015-07-22 DIAGNOSIS — H538 Other visual disturbances: Secondary | ICD-10-CM | POA: Diagnosis not present

## 2015-07-22 DIAGNOSIS — J3089 Other allergic rhinitis: Secondary | ICD-10-CM | POA: Diagnosis not present

## 2015-07-22 DIAGNOSIS — Z136 Encounter for screening for cardiovascular disorders: Secondary | ICD-10-CM | POA: Diagnosis not present

## 2015-07-22 DIAGNOSIS — Z0000 Encounter for general adult medical examination without abnormal findings: Secondary | ICD-10-CM | POA: Diagnosis not present

## 2015-07-22 DIAGNOSIS — Z131 Encounter for screening for diabetes mellitus: Secondary | ICD-10-CM | POA: Diagnosis not present

## 2015-07-22 DIAGNOSIS — R5383 Other fatigue: Secondary | ICD-10-CM | POA: Diagnosis not present

## 2015-07-22 DIAGNOSIS — Z113 Encounter for screening for infections with a predominantly sexual mode of transmission: Secondary | ICD-10-CM | POA: Diagnosis not present

## 2015-07-22 DIAGNOSIS — R7303 Prediabetes: Secondary | ICD-10-CM | POA: Diagnosis not present

## 2015-07-22 DIAGNOSIS — Z0100 Encounter for examination of eyes and vision without abnormal findings: Secondary | ICD-10-CM | POA: Diagnosis not present

## 2015-07-22 DIAGNOSIS — E1140 Type 2 diabetes mellitus with diabetic neuropathy, unspecified: Secondary | ICD-10-CM | POA: Diagnosis not present

## 2015-07-22 DIAGNOSIS — I10 Essential (primary) hypertension: Secondary | ICD-10-CM | POA: Diagnosis not present

## 2015-07-22 DIAGNOSIS — J45909 Unspecified asthma, uncomplicated: Secondary | ICD-10-CM | POA: Diagnosis not present

## 2015-08-05 DIAGNOSIS — K219 Gastro-esophageal reflux disease without esophagitis: Secondary | ICD-10-CM | POA: Diagnosis not present

## 2015-08-05 DIAGNOSIS — J45909 Unspecified asthma, uncomplicated: Secondary | ICD-10-CM | POA: Diagnosis not present

## 2015-08-05 DIAGNOSIS — E119 Type 2 diabetes mellitus without complications: Secondary | ICD-10-CM | POA: Diagnosis not present

## 2015-08-05 DIAGNOSIS — I10 Essential (primary) hypertension: Secondary | ICD-10-CM | POA: Diagnosis not present

## 2015-08-05 DIAGNOSIS — J3089 Other allergic rhinitis: Secondary | ICD-10-CM | POA: Diagnosis not present

## 2015-08-05 DIAGNOSIS — R05 Cough: Secondary | ICD-10-CM | POA: Diagnosis not present

## 2015-08-05 DIAGNOSIS — E1140 Type 2 diabetes mellitus with diabetic neuropathy, unspecified: Secondary | ICD-10-CM | POA: Diagnosis not present

## 2015-09-23 DIAGNOSIS — J3089 Other allergic rhinitis: Secondary | ICD-10-CM | POA: Diagnosis not present

## 2015-09-23 DIAGNOSIS — J45909 Unspecified asthma, uncomplicated: Secondary | ICD-10-CM | POA: Diagnosis not present

## 2015-09-23 DIAGNOSIS — E119 Type 2 diabetes mellitus without complications: Secondary | ICD-10-CM | POA: Diagnosis not present

## 2015-09-23 DIAGNOSIS — E1140 Type 2 diabetes mellitus with diabetic neuropathy, unspecified: Secondary | ICD-10-CM | POA: Diagnosis not present

## 2015-09-23 DIAGNOSIS — I10 Essential (primary) hypertension: Secondary | ICD-10-CM | POA: Diagnosis not present

## 2015-09-23 DIAGNOSIS — L309 Dermatitis, unspecified: Secondary | ICD-10-CM | POA: Diagnosis not present

## 2015-09-23 DIAGNOSIS — K219 Gastro-esophageal reflux disease without esophagitis: Secondary | ICD-10-CM | POA: Diagnosis not present

## 2015-10-22 DIAGNOSIS — E1140 Type 2 diabetes mellitus with diabetic neuropathy, unspecified: Secondary | ICD-10-CM | POA: Diagnosis not present

## 2015-10-22 DIAGNOSIS — K219 Gastro-esophageal reflux disease without esophagitis: Secondary | ICD-10-CM | POA: Diagnosis not present

## 2015-10-22 DIAGNOSIS — J45909 Unspecified asthma, uncomplicated: Secondary | ICD-10-CM | POA: Diagnosis not present

## 2015-10-22 DIAGNOSIS — I10 Essential (primary) hypertension: Secondary | ICD-10-CM | POA: Diagnosis not present

## 2015-10-22 DIAGNOSIS — E119 Type 2 diabetes mellitus without complications: Secondary | ICD-10-CM | POA: Diagnosis not present

## 2015-10-22 DIAGNOSIS — J3089 Other allergic rhinitis: Secondary | ICD-10-CM | POA: Diagnosis not present

## 2015-12-11 DIAGNOSIS — J45909 Unspecified asthma, uncomplicated: Secondary | ICD-10-CM | POA: Diagnosis not present

## 2015-12-11 DIAGNOSIS — I10 Essential (primary) hypertension: Secondary | ICD-10-CM | POA: Diagnosis not present

## 2015-12-11 DIAGNOSIS — E1140 Type 2 diabetes mellitus with diabetic neuropathy, unspecified: Secondary | ICD-10-CM | POA: Diagnosis not present

## 2015-12-11 DIAGNOSIS — E119 Type 2 diabetes mellitus without complications: Secondary | ICD-10-CM | POA: Diagnosis not present

## 2015-12-11 DIAGNOSIS — J3089 Other allergic rhinitis: Secondary | ICD-10-CM | POA: Diagnosis not present

## 2015-12-11 DIAGNOSIS — K219 Gastro-esophageal reflux disease without esophagitis: Secondary | ICD-10-CM | POA: Diagnosis not present

## 2016-02-19 DIAGNOSIS — N926 Irregular menstruation, unspecified: Secondary | ICD-10-CM | POA: Diagnosis not present

## 2016-02-19 DIAGNOSIS — E1140 Type 2 diabetes mellitus with diabetic neuropathy, unspecified: Secondary | ICD-10-CM | POA: Diagnosis not present

## 2016-02-19 DIAGNOSIS — J3089 Other allergic rhinitis: Secondary | ICD-10-CM | POA: Diagnosis not present

## 2016-02-19 DIAGNOSIS — K219 Gastro-esophageal reflux disease without esophagitis: Secondary | ICD-10-CM | POA: Diagnosis not present

## 2016-02-19 DIAGNOSIS — J45909 Unspecified asthma, uncomplicated: Secondary | ICD-10-CM | POA: Diagnosis not present

## 2016-02-19 DIAGNOSIS — I10 Essential (primary) hypertension: Secondary | ICD-10-CM | POA: Diagnosis not present

## 2016-02-19 DIAGNOSIS — E119 Type 2 diabetes mellitus without complications: Secondary | ICD-10-CM | POA: Diagnosis not present

## 2016-04-01 DIAGNOSIS — I10 Essential (primary) hypertension: Secondary | ICD-10-CM | POA: Diagnosis not present

## 2016-04-01 DIAGNOSIS — N926 Irregular menstruation, unspecified: Secondary | ICD-10-CM | POA: Diagnosis not present

## 2016-04-01 DIAGNOSIS — J3089 Other allergic rhinitis: Secondary | ICD-10-CM | POA: Diagnosis not present

## 2016-04-01 DIAGNOSIS — J45909 Unspecified asthma, uncomplicated: Secondary | ICD-10-CM | POA: Diagnosis not present

## 2016-04-01 DIAGNOSIS — E1140 Type 2 diabetes mellitus with diabetic neuropathy, unspecified: Secondary | ICD-10-CM | POA: Diagnosis not present

## 2016-04-01 DIAGNOSIS — K219 Gastro-esophageal reflux disease without esophagitis: Secondary | ICD-10-CM | POA: Diagnosis not present

## 2016-04-01 DIAGNOSIS — E119 Type 2 diabetes mellitus without complications: Secondary | ICD-10-CM | POA: Diagnosis not present

## 2016-05-20 DIAGNOSIS — N926 Irregular menstruation, unspecified: Secondary | ICD-10-CM | POA: Diagnosis not present

## 2016-05-20 DIAGNOSIS — J3089 Other allergic rhinitis: Secondary | ICD-10-CM | POA: Diagnosis not present

## 2016-05-20 DIAGNOSIS — J45909 Unspecified asthma, uncomplicated: Secondary | ICD-10-CM | POA: Diagnosis not present

## 2016-05-20 DIAGNOSIS — E119 Type 2 diabetes mellitus without complications: Secondary | ICD-10-CM | POA: Diagnosis not present

## 2016-05-20 DIAGNOSIS — I10 Essential (primary) hypertension: Secondary | ICD-10-CM | POA: Diagnosis not present

## 2016-05-20 DIAGNOSIS — E1140 Type 2 diabetes mellitus with diabetic neuropathy, unspecified: Secondary | ICD-10-CM | POA: Diagnosis not present

## 2016-05-20 DIAGNOSIS — K219 Gastro-esophageal reflux disease without esophagitis: Secondary | ICD-10-CM | POA: Diagnosis not present

## 2016-05-20 DIAGNOSIS — R05 Cough: Secondary | ICD-10-CM | POA: Diagnosis not present

## 2016-05-27 DIAGNOSIS — E1140 Type 2 diabetes mellitus with diabetic neuropathy, unspecified: Secondary | ICD-10-CM | POA: Diagnosis not present

## 2016-05-27 DIAGNOSIS — I10 Essential (primary) hypertension: Secondary | ICD-10-CM | POA: Diagnosis not present

## 2016-05-27 DIAGNOSIS — E119 Type 2 diabetes mellitus without complications: Secondary | ICD-10-CM | POA: Diagnosis not present

## 2016-05-27 DIAGNOSIS — K219 Gastro-esophageal reflux disease without esophagitis: Secondary | ICD-10-CM | POA: Diagnosis not present

## 2016-05-27 DIAGNOSIS — J45909 Unspecified asthma, uncomplicated: Secondary | ICD-10-CM | POA: Diagnosis not present

## 2016-05-27 DIAGNOSIS — J3089 Other allergic rhinitis: Secondary | ICD-10-CM | POA: Diagnosis not present

## 2016-05-27 DIAGNOSIS — N926 Irregular menstruation, unspecified: Secondary | ICD-10-CM | POA: Diagnosis not present

## 2016-05-27 DIAGNOSIS — R05 Cough: Secondary | ICD-10-CM | POA: Diagnosis not present

## 2016-06-08 DIAGNOSIS — I10 Essential (primary) hypertension: Secondary | ICD-10-CM | POA: Diagnosis not present

## 2016-06-08 DIAGNOSIS — E119 Type 2 diabetes mellitus without complications: Secondary | ICD-10-CM | POA: Diagnosis not present

## 2016-06-08 DIAGNOSIS — N926 Irregular menstruation, unspecified: Secondary | ICD-10-CM | POA: Diagnosis not present

## 2016-06-08 DIAGNOSIS — J45909 Unspecified asthma, uncomplicated: Secondary | ICD-10-CM | POA: Diagnosis not present

## 2016-06-08 DIAGNOSIS — E1140 Type 2 diabetes mellitus with diabetic neuropathy, unspecified: Secondary | ICD-10-CM | POA: Diagnosis not present

## 2016-06-08 DIAGNOSIS — K219 Gastro-esophageal reflux disease without esophagitis: Secondary | ICD-10-CM | POA: Diagnosis not present

## 2016-06-08 DIAGNOSIS — R05 Cough: Secondary | ICD-10-CM | POA: Diagnosis not present

## 2016-06-08 DIAGNOSIS — J3089 Other allergic rhinitis: Secondary | ICD-10-CM | POA: Diagnosis not present

## 2016-07-27 DIAGNOSIS — K219 Gastro-esophageal reflux disease without esophagitis: Secondary | ICD-10-CM | POA: Diagnosis not present

## 2016-07-27 DIAGNOSIS — N926 Irregular menstruation, unspecified: Secondary | ICD-10-CM | POA: Diagnosis not present

## 2016-07-27 DIAGNOSIS — J3089 Other allergic rhinitis: Secondary | ICD-10-CM | POA: Diagnosis not present

## 2016-07-27 DIAGNOSIS — E1140 Type 2 diabetes mellitus with diabetic neuropathy, unspecified: Secondary | ICD-10-CM | POA: Diagnosis not present

## 2016-07-27 DIAGNOSIS — J45909 Unspecified asthma, uncomplicated: Secondary | ICD-10-CM | POA: Diagnosis not present

## 2016-07-27 DIAGNOSIS — E119 Type 2 diabetes mellitus without complications: Secondary | ICD-10-CM | POA: Diagnosis not present

## 2016-07-27 DIAGNOSIS — M25561 Pain in right knee: Secondary | ICD-10-CM | POA: Diagnosis not present

## 2016-07-27 DIAGNOSIS — I10 Essential (primary) hypertension: Secondary | ICD-10-CM | POA: Diagnosis not present

## 2016-07-27 DIAGNOSIS — R05 Cough: Secondary | ICD-10-CM | POA: Diagnosis not present

## 2016-09-02 ENCOUNTER — Encounter: Payer: Self-pay | Admitting: Allergy

## 2016-09-02 ENCOUNTER — Ambulatory Visit (INDEPENDENT_AMBULATORY_CARE_PROVIDER_SITE_OTHER): Payer: Medicare Other | Admitting: Allergy

## 2016-09-02 VITALS — BP 122/70 | HR 68 | Temp 98.7°F | Resp 19 | Ht 63.5 in | Wt 194.6 lb

## 2016-09-02 DIAGNOSIS — H1010 Acute atopic conjunctivitis, unspecified eye: Secondary | ICD-10-CM

## 2016-09-02 DIAGNOSIS — J309 Allergic rhinitis, unspecified: Secondary | ICD-10-CM | POA: Diagnosis not present

## 2016-09-02 DIAGNOSIS — J4540 Moderate persistent asthma, uncomplicated: Secondary | ICD-10-CM

## 2016-09-02 LAB — CBC WITH DIFFERENTIAL/PLATELET
BASOS ABS: 0 {cells}/uL (ref 0–200)
BASOS PCT: 0 %
EOS PCT: 4 %
Eosinophils Absolute: 252 cells/uL (ref 15–500)
HCT: 40.6 % (ref 35.0–45.0)
HEMOGLOBIN: 13.4 g/dL (ref 11.7–15.5)
LYMPHS ABS: 2898 {cells}/uL (ref 850–3900)
Lymphocytes Relative: 46 %
MCH: 30.5 pg (ref 27.0–33.0)
MCHC: 33 g/dL (ref 32.0–36.0)
MCV: 92.3 fL (ref 80.0–100.0)
MONOS PCT: 8 %
MPV: 11.5 fL (ref 7.5–12.5)
Monocytes Absolute: 504 cells/uL (ref 200–950)
NEUTROS ABS: 2646 {cells}/uL (ref 1500–7800)
Neutrophils Relative %: 42 %
PLATELETS: 207 10*3/uL (ref 140–400)
RBC: 4.4 MIL/uL (ref 3.80–5.10)
RDW: 14.5 % (ref 11.0–15.0)
WBC: 6.3 10*3/uL (ref 3.8–10.8)

## 2016-09-02 MED ORDER — OLOPATADINE HCL 0.7 % OP SOLN: 1.0000 [drp] | Freq: Every day | OPHTHALMIC | 5 refills | Status: DC

## 2016-09-02 MED ORDER — AZELASTINE HCL 0.1 % NA SOLN: 2.0000 | Freq: Two times a day (BID) | NASAL | 5 refills | Status: DC

## 2016-09-02 MED ORDER — MONTELUKAST SODIUM 10 MG PO TABS: 10.0000 mg | ORAL_TABLET | Freq: Every day | ORAL | 5 refills | Status: DC

## 2016-09-02 MED ORDER — BUDESONIDE-FORMOTEROL FUMARATE 80-4.5 MCG/ACT IN AERO: 2.0000 | INHALATION_SPRAY | Freq: Two times a day (BID) | RESPIRATORY_TRACT | 5 refills | Status: DC

## 2016-09-02 MED ORDER — CARBINOXAMINE MALEATE 6 MG PO TABS: 6.0000 mg | ORAL_TABLET | Freq: Four times a day (QID) | ORAL | 5 refills | Status: DC | PRN

## 2016-09-02 NOTE — Progress Notes (Signed)
New Patient Note  RE: Angela Daugherty MRN: [id number] DOB: 14-Jan-1971 Date of Office Visit: 09/02/2016  Referring provider: Benito Mccreedy, MD Primary care provider: Benito Mccreedy, MD  Chief Complaint:  cough  History of present illness: Angela Daugherty is a 46 y.o. female presenting today for consultation for cough and allergies.  She is a former patient of ours and last saw Dr. Neldon Mc in 2012 for asthma.      She reports her cough in "acting up real bad".  She state the cough comes in spells.  She has most of the cough at night that does wake up from sleep.  Cough is dry.  She denies any wheezing but does report chest tightness.  She reports she was given medications from her PCP that help to stop the coughing but it would return once she finished the medication.   She does not know what the medications are but one was a pill and one was a liquid cough medication.   She states she has tried singulair that did not help.    She does have an albuterol inhaler.  She feels that the albuterol does not work as she feels that a lot of the medicine "escapes her mouth".  She denies any oral steroids and denies any recent hospitalization for her breathing.     She has a history of allergic rhinitis.  She feels that when her allergies are bad it worsens her breathing.  She reports runny nose, itchy eyes and sneezing.  Symptoms are year-round.   She has not used a nasal spray since the last time she has seen [place].  She does use Refresh eye drop occasionally.  She used to take Claritin and has also tried Human resources officer and Xyzal without much relief.   She takes zyrtec currently but states she ran out recently but does not feel it is helpful.  She thinks she has taken Zyrtec in the past 3 days.  No history of eczema or food allergy.   Review of systems: Review of Systems  Constitutional: Negative for chills, fever and malaise/fatigue.  HENT: Positive for congestion. Negative for ear discharge, ear  pain, nosebleeds, sinus pain, sore throat and tinnitus.   Eyes: Negative for discharge and redness.  Respiratory: Positive for cough. Negative for shortness of breath and wheezing.   Cardiovascular: Negative for chest pain.  Gastrointestinal: Negative for abdominal pain, diarrhea, heartburn, nausea and vomiting.  Musculoskeletal: Negative for joint pain and myalgias.  Skin: Negative for itching and rash.  Neurological: Negative for headaches.    Past medical history: Past Medical History:  Diagnosis Date  . Asthma   . Hypertension     Past surgical history: History reviewed. No pertinent surgical history.  Family history:  Family History  Problem Relation Age of Onset  . Asthma Mother   . Allergic rhinitis Mother   . Asthma Sister   . Allergic rhinitis Maternal Grandmother   . Asthma Maternal Grandmother     Social history: Lives in a home with a friend with gas heating and cnetraol cooling.  There is no concern for water damage, mildew or roaches in the home.Marland Kitchen  She works in [employer].  She denies any smoking history but does report she is around others who smoke non-tobacco products.    Medication List: Allergies as of 09/02/2016   No Known Allergies     Medication List       Accurate as of 09/02/16  3:27 PM. Always  use your most recent med list.          amLODipine 10 MG tablet Commonly known as:  NORVASC Take 10 mg by mouth daily.   azelastine 0.1 % nasal spray Commonly known as:  ASTELIN Place 2 sprays into both nostrils 2 (two) times daily.   budesonide-formoterol 80-4.5 MCG/ACT inhaler Commonly known as:  SYMBICORT Inhale 2 puffs into the lungs 2 (two) times daily.   Carbinoxamine Maleate 6 MG Tabs Commonly known as:  RYVENT Take 6 mg by mouth every 6 (six) hours as needed.   levocetirizine 5 MG tablet Commonly known as:  XYZAL   loratadine 10 MG dissolvable tablet Commonly known as:  CLARITIN REDITABS Take 20 mg by mouth daily.     meclizine 25 MG tablet Commonly known as:  ANTIVERT Take 1 tablet (25 mg total) by mouth 3 (three) times daily as needed for dizziness or nausea.   metFORMIN 500 MG tablet Commonly known as:  GLUCOPHAGE Take 500 mg by mouth 2 (two) times daily.   montelukast 10 MG tablet Commonly known as:  SINGULAIR Take 1 tablet (10 mg total) by mouth at bedtime.   Olopatadine HCl 0.7 % Soln Commonly known as:  PAZEO Place 1 drop into both eyes daily.   ondansetron 8 MG tablet Commonly known as:  ZOFRAN Take 1 tablet (8 mg total) by mouth every 8 (eight) hours as needed for nausea or vomiting.       Known medication allergies: No Known Allergies   Physical examination: Blood pressure 122/70, pulse 68, temperature 98.7 F (37.1 C), resp. rate 19, height 5' 3.5" (1.613 m), weight 194 lb 9.6 oz (88.3 kg), SpO2 96 %.  General: Alert, interactive, in no acute distress. HEENT: PERRLA, TMs pearly gray, turbinates moderately edematous with clear discharge, post-pharynx non erythematous. Neck: Supple without lymphadenopathy. Lungs: Clear to auscultation without wheezing, rhonchi or rales. {no increased work of breathing. CV: Normal S1, S2 without murmurs. Abdomen: Nondistended, nontender. Skin: Warm and dry, without lesions or rashes. Extremities:  No clubbing, cyanosis or edema. Neuro:   Grossly intact.  Diagnositics/Labs:  Spirometry: FEV1: 2.24L 102%, FVC: 2.86L  109%, ratio consistent with nonobstructive pattern  Allergy testing: deferred due to recent antihistamine use  Assessment and plan:   Cough with history of asthma   - cough is most likely related to asthma with nighttime awakenings and chest tightness symptoms    - use albuterol (Ventolin) inhaler 2 puffs every 4-6 hours as needed for cough/wheeze/shortness of breath/chest tightness.  Monitor frequency of use.     - start Symbicort 45mcg 2 puffs twice a day   - use inhalers with spacer   - at this time continue singulair  10mg  daily - take at bedtime  Allergic rhinoconjunctivitis   - will obtain environmental allergen profile -- we will call you with the results   - use Dysmista (sample provided today) 1 spray each nostril twice a day.   Once you run out of this use Astelin 2 spray each nostril twice a day.     - try Ryvent 6mg  (sample provided) take 1 tab up to 3-4 time a day (every 6-8 hours as needed) as she has tried and failed standard OTC long-acting antihistamines.      - use Pazeo 1 drop each eye daily as needed for itchy/watery/red eyes.   Follow-up 3-4 months or sooner if needed Please call your primary care provider to see which cough medications you were prescribed that were  helpful with you cough and let us know.    I appreciate the opportunity to take part in Angela Daugherty's care. Please do not hesitate to contact me with questions.  Sincerely,   Prudy Feeler, MD Allergy/Immunology [facility]

## 2016-09-02 NOTE — Patient Instructions (Addendum)
Cough with history of asthma   - cough is most likely related to asthma with nighttime awakenings.    - use albuterol (Ventolin) inhaler 2 puffs every 4-6 hours as needed for cough/wheeze/shortness of breath/chest tightness.  Monitor frequency of use.     - start Symbicort 55mcg 2 puffs twice a day   - use inhalers with spacer   - at this time continue singulair 10mg  daily - take at bedtime  Allergies   - will obtain environmental allergen profile -- we will call you with the results   - use Dysmista (sample provided today) 1 spray each nostril twice a day.   Once you run out of this use Astelin 2 spray each nostril twice a day.     - try Ryvent 6mg  (sample provided) take 1 tab up to 3-4 time a day (every 6-8 hours as needed).      - use Pazeo 1 drop each eye daily as needed for itchy/watery/red eyes.   Follow-up 3-4 months or sooner if needed Please call your primary care provider to see which cough medications you were prescribed that were helpful with you cough and let us know.

## 2016-09-05 DIAGNOSIS — R05 Cough: Secondary | ICD-10-CM | POA: Diagnosis not present

## 2016-09-05 DIAGNOSIS — E119 Type 2 diabetes mellitus without complications: Secondary | ICD-10-CM | POA: Diagnosis not present

## 2016-09-05 DIAGNOSIS — K219 Gastro-esophageal reflux disease without esophagitis: Secondary | ICD-10-CM | POA: Diagnosis not present

## 2016-09-05 DIAGNOSIS — M25561 Pain in right knee: Secondary | ICD-10-CM | POA: Diagnosis not present

## 2016-09-05 DIAGNOSIS — J45909 Unspecified asthma, uncomplicated: Secondary | ICD-10-CM | POA: Diagnosis not present

## 2016-09-05 DIAGNOSIS — J3089 Other allergic rhinitis: Secondary | ICD-10-CM | POA: Diagnosis not present

## 2016-09-05 DIAGNOSIS — N926 Irregular menstruation, unspecified: Secondary | ICD-10-CM | POA: Diagnosis not present

## 2016-09-05 DIAGNOSIS — E1140 Type 2 diabetes mellitus with diabetic neuropathy, unspecified: Secondary | ICD-10-CM | POA: Diagnosis not present

## 2016-09-05 DIAGNOSIS — I10 Essential (primary) hypertension: Secondary | ICD-10-CM | POA: Diagnosis not present

## 2016-09-05 LAB — CP584 ZONE 3
ALLERGEN, COMM SILVER BIRCH, T3: 31.9 kU/L — AB
ALLERGEN, D PTERNOYSSINUS, D1: 0.62 kU/L — AB
Allergen, A. alternata, m6: <0.1 kU/L
Allergen, Black Locust, Acacia9: 0.98 kU/L — ABNORMAL HIGH
Allergen, C. Herbarum, M2: <0.1 kU/L
Allergen, Cedar tree, t12: 0.66 kU/L — ABNORMAL HIGH
Allergen, Mulberry, t76: 0.24 kU/L — ABNORMAL HIGH
Allergen, Oak,t7: 25.1 kU/L — ABNORMAL HIGH
Allergen, P. notatum, m1: <0.1 kU/L
Aspergillus fumigatus, m3: <0.1 kU/L
BERMUDA GRASS: 6.92 kU/L — AB
BOX ELDER: 3.11 kU/L — AB
Bahia Grass: 14.9 kU/L — ABNORMAL HIGH
COCKROACH: 0.17 kU/L — AB
COMMON RAGWEED: 1.74 kU/L — AB
Cat Dander: 0.43 kU/L — ABNORMAL HIGH
D. FARINAE: 0.21 kU/L — AB
Dog Dander: 0.25 kU/L — ABNORMAL HIGH
Elm IgE: 2.41 kU/L — ABNORMAL HIGH
Johnson Grass: 10.2 kU/L — ABNORMAL HIGH
Meadow Grass: 89.7 kU/L — ABNORMAL HIGH
NETTLE: 0.61 kU/L — AB
Pecan/Hickory Tree IgE: 10.7 kU/L — ABNORMAL HIGH
Plantain: 1.4 kU/L — ABNORMAL HIGH
ROUGH PIGWEED IGE: 1.11 kU/L — AB

## 2016-09-12 ENCOUNTER — Telehealth: Payer: Self-pay | Admitting: Allergy

## 2016-09-12 NOTE — Telephone Encounter (Signed)
Patient saw Dr. Nelva Bush on 09-02-16. She said she has had a persistent cough that will not go away and would like advice on what she can take for it.

## 2016-09-12 NOTE — Telephone Encounter (Signed)
Delsum placed up front for pt to pick up as well.

## 2016-09-12 NOTE — Telephone Encounter (Signed)
Reviewed note and discussed with Gara Kroner CMA. We will give her a couple of samples of Delsum. Call us if there is no improvement in a few days.  Salvatore Marvel, MD [facility]

## 2016-09-12 NOTE — Telephone Encounter (Signed)
Spoke with pt. Pt is using her Symbicort 2 puffs 4 times daily without relief (her normal sig is 2 puffs BID). She does not have an albuterol inhaler at home. She also does not have a spacer. Please advise on what she can do about her cough?  I will put a spacer up front for her to sign and take home. Advised her to ask for a nurse for spacer teaching.

## 2016-09-13 ENCOUNTER — Telehealth: Payer: Self-pay

## 2016-09-13 NOTE — Telephone Encounter (Signed)
Insurance approved ryvent for patient

## 2016-09-13 NOTE — Telephone Encounter (Signed)
Patient's insurance will not cover Ryvent. Please advise.

## 2016-09-30 DIAGNOSIS — N926 Irregular menstruation, unspecified: Secondary | ICD-10-CM | POA: Diagnosis not present

## 2016-09-30 DIAGNOSIS — K219 Gastro-esophageal reflux disease without esophagitis: Secondary | ICD-10-CM | POA: Diagnosis not present

## 2016-09-30 DIAGNOSIS — I10 Essential (primary) hypertension: Secondary | ICD-10-CM | POA: Diagnosis not present

## 2016-09-30 DIAGNOSIS — M25561 Pain in right knee: Secondary | ICD-10-CM | POA: Diagnosis not present

## 2016-09-30 DIAGNOSIS — J45909 Unspecified asthma, uncomplicated: Secondary | ICD-10-CM | POA: Diagnosis not present

## 2016-09-30 DIAGNOSIS — E119 Type 2 diabetes mellitus without complications: Secondary | ICD-10-CM | POA: Diagnosis not present

## 2016-09-30 DIAGNOSIS — Z0000 Encounter for general adult medical examination without abnormal findings: Secondary | ICD-10-CM | POA: Diagnosis not present

## 2016-09-30 DIAGNOSIS — J3089 Other allergic rhinitis: Secondary | ICD-10-CM | POA: Diagnosis not present

## 2016-09-30 DIAGNOSIS — E1140 Type 2 diabetes mellitus with diabetic neuropathy, unspecified: Secondary | ICD-10-CM | POA: Diagnosis not present

## 2016-10-08 ENCOUNTER — Encounter (HOSPITAL_COMMUNITY): Payer: Self-pay

## 2016-10-08 ENCOUNTER — Emergency Department (HOSPITAL_COMMUNITY): Payer: Medicare Other

## 2016-10-08 ENCOUNTER — Emergency Department (HOSPITAL_COMMUNITY)
Admission: EM | Admit: 2016-10-08 | Discharge: 2016-10-08 | Disposition: A | Discharge status: Home / Self Care | Payer: Medicare Other | Attending: Emergency Medicine | Admitting: Emergency Medicine

## 2016-10-08 DIAGNOSIS — Z79899 Other long term (current) drug therapy: Secondary | ICD-10-CM | POA: Diagnosis not present

## 2016-10-08 DIAGNOSIS — Z791 Long term (current) use of non-steroidal anti-inflammatories (NSAID): Secondary | ICD-10-CM | POA: Insufficient documentation

## 2016-10-08 DIAGNOSIS — I10 Essential (primary) hypertension: Secondary | ICD-10-CM | POA: Diagnosis not present

## 2016-10-08 DIAGNOSIS — J45909 Unspecified asthma, uncomplicated: Secondary | ICD-10-CM | POA: Insufficient documentation

## 2016-10-08 DIAGNOSIS — M25561 Pain in right knee: Secondary | ICD-10-CM | POA: Insufficient documentation

## 2016-10-08 MED ORDER — IBUPROFEN 600 MG PO TABS: 600.0000 mg | ORAL_TABLET | Freq: Four times a day (QID) | ORAL | 0 refills | Status: DC | PRN

## 2016-10-08 MED ORDER — ACETAMINOPHEN 500 MG PO TABS: 500.0000 mg | ORAL_TABLET | Freq: Four times a day (QID) | ORAL | 0 refills | Status: DC | PRN

## 2016-10-08 NOTE — Discharge Instructions (Signed)
Alternate 600 mg of ibuprofen and [phone] mg of Tylenol every 3 hours as needed for pain. Do not exceed 4000 mg of Tylenol daily. Apply ice or heat to the affected area for comfort. Wear the knee immobilizer as needed for comfort. You may use the crutches to get around. Take hot showers or hot baths and do some gentle stretching of the knee during this time. Follow-up with your primary care physician for reevaluation within 1 week, especially if symptoms persist. Return to the ED immediately if you develop any concerning signs or symptoms such as fevers, worsening joint swelling, redness, paleness or loss of pulses to the lower leg.

## 2016-10-08 NOTE — ED Triage Notes (Signed)
Pt states pain in rt knee x 2 days.  No injury.  Pain radiates up thigh

## 2016-10-08 NOTE — Progress Notes (Signed)
Orthopedic Tech Progress Note Patient Details:  Angela Daugherty Jun 06, 1970 [id number]   Ortho Devices Type of Ortho Device: Crutches, Knee Sleeve Ortho Device/Splint Location: Rt Knee Ortho Device/Splint Interventions: Application, Adjustment   Charlott Rakes 10/08/2016, 3:53 PM

## 2016-10-08 NOTE — ED Provider Notes (Signed)
[facility] DEPT Provider Note   CSN: [id number] Arrival date & time: 10/08/16  1419     History   Chief Complaint Chief Complaint  Patient presents with  . Knee Pain    HPI Angela Daugherty is a 46 y.o. female with history of obesity, GERD, learning disability, and asthma who presents today with chief complaint acute onset, gradually worsening right knee pain for 2 days. Pain is constant, sharp, primarily localized to the lateral aspect of the right knee and at times radiates down to the calf. Pain worsens with changing positions from sitting to standing and prolonged walking. She has she has been ambulating with a limp as a result of her pain. Alleviated somewhat with rest. She denies trauma, fall, numbness, tingling, or weakness. No fevers or chills. Has not tried anything for her symptoms. She has been increasing her activity with an exercise video for the past few weeks.  The history is provided by the patient.    Past Medical History:  Diagnosis Date  . Asthma   . Hypertension     Patient Active Problem List   Diagnosis Date Noted  . LESION, SCALP 04/27/2009  . THYROMEGALY 03/16/2009  . ELEVATED BP READING WITHOUT DX HYPERTENSION 03/16/2009  . ANEMIA-IRON DEFICIENCY 02/11/2009  . OBESITY 02/04/2009  . GERD 02/04/2009  . IRREGULAR MENSES 02/04/2009  . LEARNING DISABILITY 08/14/2006  . ALLERGIC RHINITIS 08/14/2006  . ASTHMA 08/14/2006  . MENINGITIS, HX OF 08/14/2006    History reviewed. No pertinent surgical history.  OB History    No data available       Home Medications    Prior to Admission medications   Medication Sig Start Date End Date Taking? Authorizing Provider  acetaminophen (TYLENOL) 500 MG tablet Take 1 tablet (500 mg total) by mouth every 6 (six) hours as needed for mild pain or moderate pain. 10/08/16   Kierah Goatley A, PA-C  amLODipine (NORVASC) 10 MG tablet Take 10 mg by mouth daily. 10/01/14   [provider]  azelastine (ASTELIN) 0.1  % nasal spray Place 2 sprays into both nostrils 2 (two) times daily. 09/02/16   Kennith Gain, MD  budesonide-formoterol (SYMBICORT) 80-4.5 MCG/ACT inhaler Inhale 2 puffs into the lungs 2 (two) times daily. 09/02/16   Kennith Gain, MD  Carbinoxamine Maleate (RYVENT) 6 MG TABS Take 6 mg by mouth every 6 (six) hours as needed. 09/02/16   Kennith Gain, MD  ibuprofen (ADVIL,MOTRIN) 600 MG tablet Take 1 tablet (600 mg total) by mouth every 6 (six) hours as needed for mild pain or moderate pain. 10/08/16   Rodell Perna A, PA-C  levocetirizine (XYZAL) 5 MG tablet  07/15/16   [provider]  loratadine (CLARITIN REDITABS) 10 MG dissolvable tablet Take 20 mg by mouth daily.    [provider]  meclizine (ANTIVERT) 25 MG tablet Take 1 tablet (25 mg total) by mouth 3 (three) times daily as needed for dizziness or nausea. 11/01/14   Street, [place], PA-C  metFORMIN (GLUCOPHAGE) 500 MG tablet Take 500 mg by mouth 2 (two) times daily. 10/03/14   [provider]  montelukast (SINGULAIR) 10 MG tablet Take 1 tablet (10 mg total) by mouth at bedtime. 09/02/16   Kennith Gain, MD  Olopatadine HCl (PAZEO) 0.7 % SOLN Place 1 drop into both eyes daily. 09/02/16   Kennith Gain, MD  ondansetron (ZOFRAN) 8 MG tablet Take 1 tablet (8 mg total) by mouth every 8 (eight) hours as needed  for nausea or vomiting. 11/01/14   Street, [place], PA-C    Family History Family History  Problem Relation Age of Onset  . Asthma Mother   . Allergic rhinitis Mother   . Asthma Sister   . Allergic rhinitis Maternal Grandmother   . Asthma Maternal Grandmother     Social History Social History  Substance Use Topics  . Smoking status: Never Smoker  . Smokeless tobacco: Never Used  . Alcohol use No     Allergies   Patient has no known allergies.   Review of Systems Review of Systems  Constitutional: Negative for chills and fever.  Musculoskeletal:  Positive for arthralgias (right knee).  Neurological: Negative for weakness and numbness.     Physical Exam Updated Vital Signs BP 129/85 (BP Location: Left Arm)   Pulse 62   Temp 98 F (36.7 C) (Oral)   Resp 17   LMP 07/17/2012   SpO2 99%   Physical Exam  Constitutional: She appears well-developed and well-nourished. No distress.  HENT:  Head: Normocephalic and atraumatic.  Eyes: Conjunctivae are normal. Right eye exhibits no discharge. Left eye exhibits no discharge.  Neck: No JVD present. No tracheal deviation present.  Cardiovascular: Normal rate and intact distal pulses.   2+ DP/PT pulses bilaterally, Homans absent bilaterally  Pulmonary/Chest: Effort normal.  Abdominal: She exhibits no distension.  Musculoskeletal: She exhibits tenderness. She exhibits no edema.       Right knee: She exhibits no swelling, no effusion, no ecchymosis, no deformity, no laceration, no erythema, normal alignment, no LCL laxity, normal patellar mobility, no bony tenderness, normal meniscus and no MCL laxity. Tenderness found. LCL tenderness noted.  Normal range of motion of right knee, however pain elicited on flexion and extension. No varus or valgus deformity, no ligamentous laxity, negative anterior/posterior drawer tests. No deformity or crepitus noted. Left knee examination normal.   Neurological: She is alert.  Fluent speech, no facial droop, sensation intact to soft touch of bilateral lower extremities. Antalgic gait, but patient able to heel walk and toe walk without difficulty.  Skin: Skin is warm and dry. Capillary refill takes less than 2 seconds. No erythema.  Psychiatric: She has a normal mood and affect. Her behavior is normal.  Nursing note and vitals reviewed.    ED Treatments / Results  Labs (all labs ordered are listed, but only abnormal results are displayed) Labs Reviewed - No data to display  EKG  EKG Interpretation None       Radiology Dg Knee Complete 4 Views  Right  Result Date: 10/08/2016 CLINICAL DATA:  Right knee pain x 2 days with no known injury; no previous injury per pt; EXAM: RIGHT KNEE - COMPLETE 4+ VIEW COMPARISON:  None. FINDINGS: Osseous alignment is normal. Bone mineralization is normal. No acute or suspicious osseous finding. No fracture line or displaced fracture fragment seen. No degenerative change. Questionable small joint effusion within the suprapatellar bursa. Superficial soft tissues are unremarkable. IMPRESSION: 1. Questionable small joint effusion within the suprapatellar bursa. 2. No osseous abnormality. Electronically Signed   By: Franki Cabot M.D.   On: 10/08/2016 15:03    Procedures Procedures (including critical care time)  Medications Ordered in ED Medications - No data to display   Initial Impression / Assessment and Plan / ED Course  I have reviewed the triage vital signs and the nursing notes.  Pertinent labs & imaging results that were available during my care of the patient were reviewed by me and considered  in my medical decision making (see chart for details).     Patient with pain to the right knee for 2 days. No known injury. Afebrile, vital signs are stable. She is neurovascularly intact. No calf pain to suggest DVT. X-rays show question of a small joint effusion but no osseous abnormality. No concern for fracture, dislocation, or osteomyelitis. RICE therapy indicated and discussed. She is stable for discharge with NSAIDs, Tylenol, crutches and knee immobilizer to help with ambulation. She will follow with her primary care physician or orthopedist for reevaluation. Discussed indications for return to the ED. Pt verbalized understanding of and agreement with plan and is safe for discharge home at this time.   Final Clinical Impressions(s) / ED Diagnoses   Final diagnoses:  Acute pain of right knee    New Prescriptions Discharge Medication List as of 10/08/2016  3:27 PM    START taking these medications     Details  acetaminophen (TYLENOL) 500 MG tablet Take 1 tablet (500 mg total) by mouth every 6 (six) hours as needed for mild pain or moderate pain., Starting Sat 10/08/2016, Print    ibuprofen (ADVIL,MOTRIN) 600 MG tablet Take 1 tablet (600 mg total) by mouth every 6 (six) hours as needed for mild pain or moderate pain., Starting Sat 10/08/2016, Print         Nils Flack, [place] A, PA-C 10/09/16 [id number]    Veryl Speak, MD 10/09/16 1536

## 2016-11-08 DIAGNOSIS — J45909 Unspecified asthma, uncomplicated: Secondary | ICD-10-CM | POA: Diagnosis not present

## 2016-11-08 DIAGNOSIS — N926 Irregular menstruation, unspecified: Secondary | ICD-10-CM | POA: Diagnosis not present

## 2016-11-08 DIAGNOSIS — E119 Type 2 diabetes mellitus without complications: Secondary | ICD-10-CM | POA: Diagnosis not present

## 2016-11-08 DIAGNOSIS — E1140 Type 2 diabetes mellitus with diabetic neuropathy, unspecified: Secondary | ICD-10-CM | POA: Diagnosis not present

## 2016-11-08 DIAGNOSIS — M25561 Pain in right knee: Secondary | ICD-10-CM | POA: Diagnosis not present

## 2016-11-08 DIAGNOSIS — I10 Essential (primary) hypertension: Secondary | ICD-10-CM | POA: Diagnosis not present

## 2016-11-08 DIAGNOSIS — J3089 Other allergic rhinitis: Secondary | ICD-10-CM | POA: Diagnosis not present

## 2016-11-08 DIAGNOSIS — R05 Cough: Secondary | ICD-10-CM | POA: Diagnosis not present

## 2016-11-08 DIAGNOSIS — K219 Gastro-esophageal reflux disease without esophagitis: Secondary | ICD-10-CM | POA: Diagnosis not present

## 2016-11-10 DIAGNOSIS — J45909 Unspecified asthma, uncomplicated: Secondary | ICD-10-CM | POA: Diagnosis not present

## 2016-11-10 DIAGNOSIS — E119 Type 2 diabetes mellitus without complications: Secondary | ICD-10-CM | POA: Diagnosis not present

## 2016-11-10 DIAGNOSIS — I10 Essential (primary) hypertension: Secondary | ICD-10-CM | POA: Diagnosis not present

## 2016-11-10 DIAGNOSIS — M25561 Pain in right knee: Secondary | ICD-10-CM | POA: Diagnosis not present

## 2016-11-10 DIAGNOSIS — E1140 Type 2 diabetes mellitus with diabetic neuropathy, unspecified: Secondary | ICD-10-CM | POA: Diagnosis not present

## 2016-11-10 DIAGNOSIS — R05 Cough: Secondary | ICD-10-CM | POA: Diagnosis not present

## 2016-11-10 DIAGNOSIS — N926 Irregular menstruation, unspecified: Secondary | ICD-10-CM | POA: Diagnosis not present

## 2016-11-10 DIAGNOSIS — J3089 Other allergic rhinitis: Secondary | ICD-10-CM | POA: Diagnosis not present

## 2016-11-10 DIAGNOSIS — K219 Gastro-esophageal reflux disease without esophagitis: Secondary | ICD-10-CM | POA: Diagnosis not present

## 2016-11-23 DIAGNOSIS — M1711 Unilateral primary osteoarthritis, right knee: Secondary | ICD-10-CM | POA: Diagnosis not present

## 2016-12-12 ENCOUNTER — Ambulatory Visit (INDEPENDENT_AMBULATORY_CARE_PROVIDER_SITE_OTHER): Payer: Medicare Other | Admitting: Internal Medicine

## 2016-12-12 ENCOUNTER — Encounter: Payer: Self-pay | Admitting: Internal Medicine

## 2016-12-12 VITALS — BP 126/84 | HR 81 | Ht 64.25 in | Wt 195.0 lb

## 2016-12-12 DIAGNOSIS — R05 Cough: Secondary | ICD-10-CM | POA: Diagnosis not present

## 2016-12-12 DIAGNOSIS — I10 Essential (primary) hypertension: Secondary | ICD-10-CM | POA: Diagnosis not present

## 2016-12-12 DIAGNOSIS — R058 Other specified cough: Secondary | ICD-10-CM

## 2016-12-12 MED ORDER — PREDNISONE 10 MG PO TABS: ORAL_TABLET | ORAL | 0 refills | Status: DC

## 2016-12-12 MED ORDER — ESOMEPRAZOLE MAGNESIUM 40 MG PO CPDR
DELAYED_RELEASE_CAPSULE | ORAL | Status: DC
Start: 2016-12-12 — End: 2017-08-24

## 2016-12-12 MED ORDER — FAMOTIDINE 20 MG PO TABS: ORAL_TABLET | ORAL | 2 refills | Status: DC

## 2016-12-12 MED ORDER — LOSARTAN POTASSIUM-HCTZ 100-25 MG PO TABS: 1.0000 | ORAL_TABLET | Freq: Every day | ORAL | 2 refills | Status: DC

## 2016-12-12 NOTE — Assessment & Plan Note (Addendum)
DDX of  difficult airways management almost all start with A and  include Adherence, Ace Inhibitors, Acid Reflux, Active Sinus Disease, Alpha 1 Antitripsin deficiency, Anxiety masquerading as Airways dz,  ABPA,  Allergy(esp in young), Aspiration (esp in elderly), Adverse effects of meds,  Active smokers, A bunch of PE's (a small clot burden can't cause this syndrome unless there is already severe underlying pulm or vascular dz with poor reserve) plus two Bs  = Bronchiectasis and Beta blocker use..and one C= CHF  Adherence is always the initial "prime suspect" and is a multilayered concern that requires a "trust but verify" approach in every patient - starting with knowing how to use medications, especially inhalers, correctly, keeping up with refills and understanding the fundamental difference between maintenance and prns vs those medications only taken for a very short course and then stopped and not refilled.  - very shaky on names of meds/ details of care   ACEi adverse effects at the  top of the usual list of suspects and the only way to rule it out is a trial off > see a/p    ? Acid (or non-acid) GERD > always difficult to exclude as up to 75% of pts in some series report no assoc GI/ Heartburn symptoms> rec change to  max (24h)  acid suppression and diet restrictions/ reviewed      ? Allergy/ asthma > continue singulair, Prednisone 10 mg take  4 each am x 2 days,   2 each am x 2 days,  1 each am x 2 days and stop then return for w/u in 6 weeks if not better by then    Total time devoted to counseling  > 50 % of initial 60 min office visit:  review case with pt/ discussion of options/alternatives/ personally creating written customized instructions  in presence of pt  then going over those specific  Instructions directly with the pt including how to use all of the meds but in particular covering each new medication in detail and the difference between the maintenance= "automatic" meds and the  prns using an action plan format for the latter (If this problem/symptom => do that organization reading Left to right).  Please see AVS from this visit for a full list of these instructions which I personally wrote for this pt and  are unique to this visit.

## 2016-12-12 NOTE — Progress Notes (Signed)
Subjective:     Patient ID: Angela Daugherty, female   DOB: 05/07/1970,    MRN: [id number]  HPI  42 yobf never smoker with ? Seasonal rhinitis on allergy shots Dr Shara Blazing seemed better then ? Intermittent  asthma in her late 20's  maint on singulair/prn saba but much worse symptoms of coughing x summer 2018  referred to pulmonary clinic 12/12/2016 by Dr   Sharol Roussel Bonsu's APP    12/12/2016 1st [facility] office visit/ Angela Daugherty   Chief Complaint  Patient presents with  . Pulmonary Consult    Referred by Raelyn Number, PA.  She c/o cough x 3 months- occ prod with clear sputum.  Cough is esp worse at night and sometimes wakes her up.    symptoms day = throat clearing and noct min productive cough wakes her no better on inhaler or after adding nexium  No real flare in sinus symptoms despite worse cough / not much better with saba but prednisone seems to help  . No obvious day to day or daytime variability or assoc excess/ purulent sputum or mucus plugs or hemoptysis or cp or chest tightness, subjective wheeze or overt sinus or hb symptoms. No unusual exp hx or h/o childhood pna/ asthma or knowledge of premature birth.   Also denies any obvious fluctuation of symptoms with weather or environmental changes or other aggravating or alleviating factors except as outlined above   Current Allergies, Complete Past Medical History, Past Surgical History, Family History, and Social History were reviewed in [employer] record.  ROS  The following are not active complaints unless bolded Hoarseness, sore throat, dysphagia, dental problems, itching, sneezing,  nasal congestion or discharge of excess mucus or purulent secretions, ear ache,   fever, chills, sweats, unintended wt loss or wt gain, classically pleuritic or exertional cp,  orthopnea pnd or leg swelling, presyncope, palpitations, abdominal pain, anorexia, nausea, vomiting, diarrhea  or change in bowel habits or change in bladder  habits, change in stools or change in urine, dysuria, hematuria,  rash, arthralgias, visual complaints, headache, numbness, weakness or ataxia or problems with walking or coordination,  change in mood/affect or memory.        Current Meds  Medication Sig  . albuterol (PROAIR HFA) 108 (90 Base) MCG/ACT inhaler Inhale 2 puffs into the lungs every 6 (six) hours as needed for wheezing or shortness of breath.  Marland Kitchen amLODipine (NORVASC) 10 MG tablet Take 10 mg by mouth daily.  . Carbinoxamine Maleate (RYVENT) 6 MG TABS Take 6 mg by mouth every 6 (six) hours as needed.  . cetirizine (ZYRTEC) 10 MG tablet Take 10 mg by mouth daily.  Marland Kitchen esomeprazole (NEXIUM) 40 MG capsule Take 40 mg by mouth daily at 12 noon.  Marland Kitchen levocetirizine (XYZAL) 5 MG tablet   . lisinopril-hydrochlorothiazide (PRINZIDE,ZESTORETIC) 20-12.5 MG tablet Take 1 tablet by mouth daily.  . metFORMIN (GLUCOPHAGE) 500 MG tablet Take 500 mg by mouth 2 (two) times daily.  . montelukast (SINGULAIR) 10 MG tablet Take 1 tablet (10 mg total) by mouth at bedtime.            Review of Systems     Objective:   Physical Exam    amb hoarse obese bf nad with harsh/  upper airway sounding dry shrill quality coughing fits / voice fatigue   Wt Readings from Last 3 Encounters:  12/12/16 195 lb (88.5 kg)  09/02/16 194 lb 9.6 oz (88.3 kg)  11/01/14 217 lb (98.4 kg)  Vital signs reviewed - Note on arrival 02 sats  100% on RA     HEENT: nl dentition, turbinates bilaterally, and oropharynx. Nl external ear canals without cough reflex   NECK :  without JVD/Nodes/TM/ nl carotid upstrokes bilaterally   LUNGS: no acc muscle use,  Nl contour chest which is clear to A and P bilaterally without cough on insp or exp maneuvers   CV:  RRR  no s3 or murmur or increase in P2, and no edema   ABD:  soft and nontender with nl inspiratory excursion in the supine position. No bruits or organomegaly appreciated, bowel sounds nl  MS:  Nl gait/ ext warm  without deformities, calf tenderness, cyanosis or clubbing No obvious joint restrictions   SKIN: warm and dry without lesions    NEURO:  alert, approp, nl sensorium with  no motor or cerebellar deficits apparent.      Assessment:

## 2016-12-12 NOTE — Patient Instructions (Addendum)
Stop lisinopril   Start losartan 100- 25 mg daily in its place  Continue nexium Take 30-60 min before first meal of the day and add pepcid ac 20 mg at bedtime until you stop stopping  Prednisone 10 mg take  4 each am x 2 days,   2 each am x 2 days,  1 each am x 2 days and stop   See Caryl Pina in November as planned if all better and if not return here

## 2016-12-12 NOTE — Assessment & Plan Note (Signed)
In the best review of chronic cough to date ( NEJM 2016 [id number]) ,  ACEi are now felt to cause cough in up to  20% of pts which is a 4 fold increase from previous reports and does not include the variety of non-specific complaints we see in pulmonary clinic in pts on ACEi but previously attributed to another dx like  Copd/asthma and  include PNDS, throat and chest congestion, "bronchitis", unexplained dyspnea and noct "strangling" sensations, and hoarseness, but also  atypical /refractory GERD symptoms like dysphagia and "bad heartburn"   The only way I know  to prove this is not an "ACEi Case" is a trial off ACEi x a minimum of 6 weeks then regroup.   Try losartan 100-25 one daily x 6 weeks and return to pulmonary clinic if not all better,  O/w Follow up per Primary Care planned

## 2016-12-13 ENCOUNTER — Institutional Professional Consult (permissible substitution): Payer: Medicare Other | Admitting: Internal Medicine

## 2016-12-14 ENCOUNTER — Ambulatory Visit: Payer: Medicare Other | Admitting: Allergy

## 2016-12-22 ENCOUNTER — Encounter: Payer: Self-pay | Admitting: Internal Medicine

## 2016-12-22 ENCOUNTER — Ambulatory Visit (INDEPENDENT_AMBULATORY_CARE_PROVIDER_SITE_OTHER): Payer: Medicare Other | Admitting: Internal Medicine

## 2016-12-22 ENCOUNTER — Ambulatory Visit (INDEPENDENT_AMBULATORY_CARE_PROVIDER_SITE_OTHER)
Admission: RE | Admit: 2016-12-22 | Discharge: 2016-12-22 | Disposition: A | Discharge status: Home / Self Care | Payer: Medicare Other | Source: Ambulatory Visit | Attending: Internal Medicine | Admitting: Internal Medicine

## 2016-12-22 VITALS — BP 134/80 | HR 88 | Ht 64.0 in | Wt 189.0 lb

## 2016-12-22 DIAGNOSIS — R058 Other specified cough: Secondary | ICD-10-CM

## 2016-12-22 DIAGNOSIS — I10 Essential (primary) hypertension: Secondary | ICD-10-CM

## 2016-12-22 DIAGNOSIS — R05 Cough: Secondary | ICD-10-CM | POA: Diagnosis not present

## 2016-12-22 MED ORDER — TRAMADOL HCL 50 MG PO TABS: ORAL_TABLET | ORAL | 0 refills | Status: DC

## 2016-12-22 NOTE — Patient Instructions (Addendum)
Take delsym two tsp every 12 hours and supplement if needed with  tramadol 50 mg up to 1  every 4 hours to suppress the urge to cough. Swallowing water or using ice chips/non mint and menthol containing candies (such as lifesavers or sugarless jolly ranchers) are also effective.  You should rest your voice and avoid activities that you know make you cough.  Once you have eliminated the cough for 3 straight days try reducing the tramadol first,  then the delsym as tolerated.     For drainage/sneezing  / throat tickle try take CHLORPHENIRAMINE  4 mg - take one every 4 hours as needed - available over the counter- may cause drowsiness so start with just a bedtime dose or two and see how you tolerate it before trying in daytime     Add pepcid 20 mg (famotidine)  on at bedtime until return    Please remember to go to the  x-ray department downstairs in the basement  for your tests - we will call you with the results when they are available.     See Tammy NP w/in 2 weeks with all your medications, even over the counter meds, separated in two separate bags, the ones you take no matter what vs the ones you stop once you feel better and take only as needed when you feel you need them.    (not a med calendar)

## 2016-12-22 NOTE — Progress Notes (Signed)
Subjective:     Patient ID: Angela Daugherty, female   DOB: 01/18/1971,    MRN: [id number]    Brief patient profile:  72 yobf never smoker with ? Seasonal rhinitis on allergy shots Angela Daugherty seemed better then ? Intermittent  asthma in her late 20's  maint on singulair/prn saba but much worse symptoms of coughing x summer 2018  referred to pulmonary clinic 12/12/2016 by Angela   Barnie Daugherty APP   History of Present Illness  12/12/2016 1st [facility] office visit/ Angela Daugherty   Chief Complaint  Patient presents with  . Pulmonary Consult    Referred by Angela Number, PA.  She c/o cough x 3 months- occ prod with clear sputum.  Cough is esp worse at night and sometimes wakes her up.    symptoms day = night = throat clearing and noct min productive cough wakes her no better on inhaler or after adding nexium rec Stop lisinopril  Start losartan 100- 25 mg daily in its place Continue nexium Take 30-60 min before first meal of the day and add pepcid ac 20 mg at bedtime until you stop coughing  Prednisone 10 mg take  4 each am x 2 days,   2 each am x 2 days,  1 each am x 2 days and stop     12/22/2016  f/u ov/Angela Daugherty re:  Cough off acei since last ov but did not follow the other instructions / rec check bp Chief Complaint  Patient presents with  . Follow-up    patient states that she has not been able to get rid of her cough and sneezing.  changed lisinopril  To losartan 100-25 Ran out of nexium one day prior to OV   Not on pepcid  at all Lots of nose runny nose/ sneezing ? On zyrtec  No obvious day to day or daytime variability or assoc excess/ purulent sputum or mucus plugs or hemoptysis or cp or chest tightness, subjective wheeze or overt   hb symptoms. No unusual exp hx or h/o childhood pna/ asthma or knowledge of premature birth.  Sleeping ok flat without nocturnal  or early am exacerbation  of respiratory  c/o's or need for noct saba. Also denies any obvious fluctuation of symptoms with  weather or environmental changes or other aggravating or alleviating factors except as outlined above   Current Allergies, Complete Past Medical History, Past Surgical History, Family History, and Social History were reviewed in [employer] record.  ROS  The following are not active complaints unless bolded Hoarseness, sore throat, dysphagia, dental problems, itching, sneezing,  nasal congestion or discharge of excess mucus or purulent secretions, ear ache,   fever, chills, sweats, unintended wt loss or wt gain, classically pleuritic or exertional cp,  orthopnea pnd or leg swelling, presyncope, palpitations, abdominal pain, anorexia, nausea, vomiting, diarrhea  or change in bowel habits or change in bladder habits, change in stools or change in urine, dysuria, hematuria,  rash, arthralgias, visual complaints, headache, numbness, weakness or ataxia or problems with walking or coordination,  change in mood/affect or memory.        Current Meds  - NOT able to verify  Medication Sig  . albuterol (PROAIR HFA) 108 (90 Base) MCG/ACT inhaler Inhale 2 puffs into the lungs every 6 (six) hours as needed for wheezing or shortness of breath.  Marland Kitchen amLODipine (NORVASC) 10 MG tablet Take 10 mg by mouth daily.  . Carbinoxamine Maleate (RYVENT) 6 MG TABS Take  6 mg by mouth every 6 (six) hours as needed.  Marland Kitchen esomeprazole (NEXIUM) 40 MG capsule Take 30-60 min before first meal of the day  . famotidine (PEPCID) 20 MG tablet One at bedtime  . losartan-hydrochlorothiazide (HYZAAR) 100-25 MG tablet Take 1 tablet by mouth daily.  . metFORMIN (GLUCOPHAGE) 500 MG tablet Take 500 mg by mouth 2 (two) times daily.  . montelukast (SINGULAIR) 10 MG tablet Take 1 tablet (10 mg total) by mouth at bedtime.  . [DISCONTINUED] cetirizine (ZYRTEC) 10 MG tablet Take 10 mg by mouth daily.  . [DISCONTINUED] levocetirizine (XYZAL) 5 MG tablet                     Objective:   Physical Exam    amb bf  /smiling/ laughing s coughing fits as at last ov but "my cough is worse" so ? Belle affect?    12/22/2016     189   12/12/16 195 lb (88.5 kg)  09/02/16 194 lb 9.6 oz (88.3 kg)  11/01/14 217 lb (98.4 kg)    Vital signs reviewed - Note on arrival 02 sats  99% on RA and bp 134/80     HEENT: nl dentition, turbinates bilaterally, and oropharynx. Nl external ear canals without cough reflex   NECK :  without JVD/Nodes/TM/ nl carotid upstrokes bilaterally   LUNGS: no acc muscle use,  Nl contour chest which is clear to A and P bilaterally without cough on insp or exp maneuvers   CV:  RRR  no s3 or murmur or increase in P2, and no edema   ABD:  soft and nontender with nl inspiratory excursion in the supine position. No bruits or organomegaly appreciated, bowel sounds nl  MS:  Nl gait/ ext warm without deformities, calf tenderness, cyanosis or clubbing No obvious joint restrictions   SKIN: warm and dry without lesions    NEURO:  alert, approp, nl sensorium with  no motor or cerebellar deficits apparent.    CXR PA and Lateral:   12/22/2016 :    I personally reviewed images and agree with radiology impression as follows:    Mild interstitial prominence and hyperinflation consistent with known reactive airway disease. There is no pneumonia, CHF, nor other acute cardiopulmonary abnormality. My impression:  Minimal non-specific and very unimpressive changes of ? Significance      Assessment:

## 2016-12-23 ENCOUNTER — Encounter: Payer: Self-pay | Admitting: Internal Medicine

## 2016-12-23 NOTE — Assessment & Plan Note (Signed)
Try off acei 12/12/2016  -  No better 12/22/2016 so rec max rx for gerd/ 1st gen H1 blockers per guidelines  And cyclical cough regimen  Her interview and exam are dramatically improved from 10 d ago and yet she is "no better" with lots of non-specific upper airway symptoms that may require additional testing but it's way too early to say to what extent they were/are being caused by residual effects of longterm acei use vs cyclical in nature  Of the three most common causes of  Sub-acute or recurrent or chronic cough, only one (GERD)  can actually contribute to/ trigger  the other two (asthma and post nasal drip syndrome)  and perpetuate the cylce of cough.  While not intuitively obvious, many patients with chronic low grade reflux do not cough until there is a primary insult that disturbs the protective epithelial barrier and exposes sensitive nerve endings.   This is typically viral but can be direct physical injury such as with an endotracheal tube.   The point is that once this occurs, it is difficult to eliminate the cycle  using anything but a maximally effective acid suppression regimen at least in the short run, accompanied by an appropriate diet to address non acid GERD and control / eliminate the cough itself for at least 3 days.   rec use tramadol to stop the cycle and max rx for gerd/ rhinitis as per avs  I had an extended discussion with the patient reviewing all relevant studies completed to date and  lasting 15 to 20 minutes of a 25 minute visit    Each maintenance medication was reviewed in detail including most importantly the difference between maintenance and prns and under what circumstances the prns are to be triggered using an action plan format that is not reflected in the computer generated alphabetically organized AVS.    Please see AVS for specific instructions unique to this visit that I personally wrote and verbalized to the the pt in detail and then reviewed with pt  by my  nurse highlighting any  changes in therapy recommended at today's visit to their plan of care.

## 2016-12-23 NOTE — Progress Notes (Signed)
LMTCB

## 2016-12-23 NOTE — Assessment & Plan Note (Signed)
Adequate control on present rx, reviewed in detail with pt > no change in rx needed   Although even in retrospect it may not be clear the ACEi contributed to the pt's symptoms,  Pt improved off them and adding them back at this point or in the future would risk confusion in interpretation of non-specific respiratory symptoms to which this patient is prone  ie  Better not to muddy the waters here.   Regroup in 2 more weeks with NP with all meds in hand using a trust but verify approach to confirm accurate Medication  Reconciliation The principal here is that until we are certain that the  patients are doing what we've asked, it makes no sense to ask them to do more.

## 2016-12-26 NOTE — Progress Notes (Signed)
Spoke with pt and notified of results per Dr. Wert. Pt verbalized understanding and denied any questions. 

## 2016-12-27 ENCOUNTER — Institutional Professional Consult (permissible substitution): Payer: Medicare Other | Admitting: Internal Medicine

## 2016-12-30 ENCOUNTER — Telehealth: Payer: Self-pay | Admitting: Internal Medicine

## 2016-12-30 NOTE — Telephone Encounter (Signed)
Spoke with pharmacist at [facility]. She stated that per the CDC guidelines, [facility] would not be able to fill patient's tramadol RX the way that is written. Because she has never had tramadol before, they want to know if the prescriber would be willing to change the instructions to "1-2 tablets every 6 hours as needed for cough or pain".   MW, please advise. Thanks!

## 2017-01-02 NOTE — Telephone Encounter (Signed)
Spoke with pharmacist and changed sig on Tramadol prescription to 1-2 every 6 hours. Pharmacist will change sig and nothing further is needed.

## 2017-01-02 NOTE — Telephone Encounter (Signed)
That's fine

## 2017-01-09 ENCOUNTER — Encounter: Payer: Medicare Other | Admitting: Adult Health

## 2017-01-12 ENCOUNTER — Ambulatory Visit (INDEPENDENT_AMBULATORY_CARE_PROVIDER_SITE_OTHER): Payer: Medicare Other | Admitting: Adult Health

## 2017-01-12 ENCOUNTER — Telehealth: Payer: Self-pay | Admitting: Adult Health

## 2017-01-12 ENCOUNTER — Encounter: Payer: Self-pay | Admitting: Adult Health

## 2017-01-12 DIAGNOSIS — R058 Other specified cough: Secondary | ICD-10-CM

## 2017-01-12 DIAGNOSIS — R05 Cough: Secondary | ICD-10-CM

## 2017-01-12 DIAGNOSIS — J309 Allergic rhinitis, unspecified: Secondary | ICD-10-CM | POA: Diagnosis not present

## 2017-01-12 DIAGNOSIS — Z23 Encounter for immunization: Secondary | ICD-10-CM

## 2017-01-12 NOTE — Progress Notes (Signed)
Chart and office note reviewed in detail  > agree with a/p as outlined    

## 2017-01-12 NOTE — Patient Instructions (Signed)
Follow med calendar closely and bring to each visit .  Flu shot today  Follow up with Dr. Melvyn Novas  In 2-3 months and As needed

## 2017-01-12 NOTE — Progress Notes (Signed)
@Patient  ID: Angela Daugherty, female    DOB: March 18, 1970, 46 y.o.   MRN: [id number]  Chief Complaint  Patient presents with  . Follow-up    cough     Referring provider: Benito Mccreedy, MD  HPI: 46 year old female never smoker seen for pulmonary consult December 12, 2016 for cough and allergies  Test  Spirometry 08/2016 nml   01/12/2017 follow-up cough Patient returns for a 2-week follow-up.  Patient was seen last visit for persistent cough.  Patient was changed off of her ACE inhibitor last month.  She was placed on medications for cough control and sinus drainage.  Patient says she is starting to feel better cough is decreasing.  It is not totally gone but much better.  She denies any fever hemoptysis orthopnea PND or leg swelling.  We reviewed all her medications organized them into a medication calendar with patient education. Patient says she has not taken a few of the medicines on her med list.  This was updated accordingly.Marland Kitchen She was recommended to begin Pepcid however did not take this.  She is taking Nexium daily.  She denies any reflux..   No Known Allergies  Immunization History  Administered Date(s) Administered  . Influenza,inj,Quad PF,6+ Mos 01/12/2017  . Td 08/24/1999    Past Medical History:  Diagnosis Date  . Asthma   . Hypertension     Tobacco History: Social History   Tobacco Use  Smoking Status Never Smoker  Smokeless Tobacco Never Used   Counseling given: Not Answered   Outpatient Encounter Medications as of 01/12/2017  Medication Sig  . albuterol (PROAIR HFA) 108 (90 Base) MCG/ACT inhaler Inhale 2 puffs into the lungs every 6 (six) hours as needed for wheezing or shortness of breath.  . esomeprazole (NEXIUM) 40 MG capsule Take 30-60 min before first meal of the day  . famotidine (PEPCID) 20 MG tablet One at bedtime  . losartan-hydrochlorothiazide (HYZAAR) 100-25 MG tablet Take 1 tablet by mouth daily.  . metFORMIN (GLUCOPHAGE) 500 MG tablet  Take 500 mg by mouth 2 (two) times daily.  . montelukast (SINGULAIR) 10 MG tablet Take 1 tablet (10 mg total) by mouth at bedtime.  . traMADol (ULTRAM) 50 MG tablet 1-2 every 4 hours as needed for cough or pain  . amLODipine (NORVASC) 10 MG tablet Take 10 mg by mouth daily.  . Carbinoxamine Maleate (RYVENT) 6 MG TABS Take 6 mg by mouth every 6 (six) hours as needed. (Patient not taking: Reported on 01/12/2017)   No facility-administered encounter medications on file as of 01/12/2017.      Review of Systems  Constitutional:   No  weight loss, night sweats,  Fevers, chills, fatigue, or  lassitude.  HEENT:   No headaches,  Difficulty swallowing,  Tooth/dental problems, or  Sore throat,                No sneezing, itching, ear ache,  +nasal congestion, post nasal drip,   CV:  No chest pain,  Orthopnea, PND, swelling in lower extremities, anasarca, dizziness, palpitations, syncope.   GI  No heartburn, indigestion, abdominal pain, nausea, vomiting, diarrhea, change in bowel habits, loss of appetite, bloody stools.   Resp:  .  No chest wall deformity  Skin: no rash or lesions.  GU: no dysuria, change in color of urine, no urgency or frequency.  No flank pain, no hematuria   MS:  No joint pain or swelling.  No decreased range of motion.  No back  pain.    Physical Exam  BP 118/80 (BP Location: Left Arm, Cuff Size: Normal)   Pulse 75   Ht 5\' 5"  (1.651 m)   Wt 191 lb (86.6 kg)   LMP 07/17/2012   SpO2 96%   BMI 31.78 kg/m   GEN: A/Ox3; pleasant , NAD, obese    HEENT:  [place]/AT,  EACs-clear, TMs-wnl, NOSE-clear, THROAT-clear, no lesions, no postnasal drip or exudate noted.   NECK:  Supple w/ fair ROM; no JVD; normal carotid impulses w/o bruits; no thyromegaly or nodules palpated; no lymphadenopathy.    RESP  Clear  P & A; w/o, wheezes/ rales/ or rhonchi. no accessory muscle use, no dullness to percussion  CARD:  RRR, no m/r/g, no peripheral edema, pulses intact, no cyanosis or  clubbing.  GI:   Soft & nt; nml bowel sounds; no organomegaly or masses detected.   Musco: Warm bil, no deformities or joint swelling noted.   Neuro: alert, no focal deficits noted.    Skin: Warm, no lesions or rashes    Lab Results:  CBC  ProBNP No results found for: PROBNP  Imaging: Dg Chest 2 View  Result Date: 12/22/2016 CLINICAL DATA:  Chronic nonproductive cough. History of hypertension, diabetes, asthma, nonsmoker. EXAM: CHEST  2 VIEW COMPARISON:  Chest x-ray of April 16, 2010 FINDINGS: The lungs are well-expanded with mild hemidiaphragm flattening. The interstitial markings are mildly prominent. There is no alveolar infiltrate or pleural effusion. The heart and pulmonary vascularity are normal. The mediastinum is normal in width. There is no pleural effusion. IMPRESSION: Mild interstitial prominence and hyperinflation consistent with known reactive airway disease. There is no pneumonia, CHF, nor other acute cardiopulmonary abnormality. Electronically Signed   By: David  Martinique M.D.   On: 12/22/2016 17:11     Assessment & Plan:   Upper airway cough syndrome Improving off ACE inhibitor . Continue to control cough and trigger prevention   Plan  Patient Instructions  Follow med calendar closely and bring to each visit .  Flu shot today  Follow up with Dr. Melvyn Novas  In 2-3 months and As needed       Allergic rhinitis Cont on current regimen      Rexene Edison, NP 01/12/2017

## 2017-01-12 NOTE — Assessment & Plan Note (Addendum)
Improving off ACE inhibitor . Continue to control cough and trigger prevention   Plan  Patient Instructions  Follow med calendar closely and bring to each visit .  Flu shot today  Follow up with Dr. Melvyn Novas  In 2-3 months and As needed

## 2017-01-12 NOTE — Assessment & Plan Note (Signed)
Cont on current regimen  

## 2017-01-30 ENCOUNTER — Ambulatory Visit (INDEPENDENT_AMBULATORY_CARE_PROVIDER_SITE_OTHER): Payer: Medicare Other | Admitting: Internal Medicine

## 2017-01-30 ENCOUNTER — Encounter: Payer: Self-pay | Admitting: Internal Medicine

## 2017-01-30 ENCOUNTER — Other Ambulatory Visit (INDEPENDENT_AMBULATORY_CARE_PROVIDER_SITE_OTHER): Payer: Medicare Other

## 2017-01-30 VITALS — BP 134/90 | HR 80 | Ht 64.0 in | Wt 197.6 lb

## 2017-01-30 DIAGNOSIS — R05 Cough: Secondary | ICD-10-CM

## 2017-01-30 DIAGNOSIS — R058 Other specified cough: Secondary | ICD-10-CM

## 2017-01-30 LAB — CBC WITH DIFFERENTIAL/PLATELET
BASOS ABS: 0 10*3/uL (ref 0.0–0.1)
Basophils Relative: 0.6 % (ref 0.0–3.0)
EOS ABS: 0.3 10*3/uL (ref 0.0–0.7)
Eosinophils Relative: 4.7 % (ref 0.0–5.0)
HCT: 40.9 % (ref 36.0–46.0)
HEMOGLOBIN: 13.7 g/dL (ref 12.0–15.0)
Lymphocytes Relative: 35.6 % (ref 12.0–46.0)
Lymphs Abs: 2.3 10*3/uL (ref 0.7–4.0)
MCHC: 33.4 g/dL (ref 30.0–36.0)
MCV: 93.9 fl (ref 78.0–100.0)
MONO ABS: 0.6 10*3/uL (ref 0.1–1.0)
Monocytes Relative: 9.1 % (ref 3.0–12.0)
NEUTROS PCT: 50 % (ref 43.0–77.0)
Neutro Abs: 3.2 10*3/uL (ref 1.4–7.7)
Platelets: 206 10*3/uL (ref 150.0–400.0)
RBC: 4.35 Mil/uL (ref 3.87–5.11)
RDW: 13.4 % (ref 11.5–15.5)
WBC: 6.4 10*3/uL (ref 4.0–10.5)

## 2017-01-30 LAB — NITRIC OXIDE: Nitric Oxide: 5

## 2017-01-30 MED ORDER — PREDNISONE 10 MG PO TABS: ORAL_TABLET | ORAL | 0 refills | Status: DC

## 2017-01-30 NOTE — Patient Instructions (Addendum)
Please remember to go to the lab department downstairs in the basement  for your tests - we will call you with the results when they are available.      See calendar for specific medication instructions and bring it back for each and every office visit for every healthcare provider you see.  Without it,  you may not receive the best quality medical care that we feel you deserve.  You will note that the calendar groups together  your maintenance  medications that are timed at particular times of the day.  Think of this as your checklist for what your doctor has instructed you to do until your next evaluation to see what benefit  there is  to staying on a consistent group of medications intended to keep you well.  The other group at the bottom is entirely up to you to use as you see fit  for specific symptoms that may arise between visits that require you to treat them on an as needed basis.  Think of this as your action plan or "what if" list.   Separating the top medications from the bottom group is fundamental to providing you adequate care going forward.     Prednisone 10 mg take  4 each am x 2 days,   2 each am x 2 days,  1 each am x 2 days and stop    See Tammy NP w/in 2 weeks with all your medications, even over the counter meds, separated in two separate bags, the ones you take no matter what vs the ones you stop once you feel better and take only as needed when you feel you need them.   Tammy  will generate for you a new user friendly medication calendar that will put [place] all on the same page re: your medication use.   Late add:  Consider trial off arb, methacholine challenge then gabapentin trial in that order

## 2017-01-30 NOTE — Progress Notes (Signed)
Subjective:     Patient ID: Angela Daugherty, female   DOB: [birth year],    MRN: [id number]    Brief patient profile:  73 yobf never smoker with ? Seasonal rhinitis on allergy shots Angela Daugherty seemed better then ? Intermittent  asthma in her late 20's  maint on singulair/prn saba but much worse symptoms of coughing x summer 2018  referred to pulmonary clinic 12/12/2016 by Angela   Barnie Daugherty APP     History of Present Illness  12/12/2016 1st [name]   Chief Complaint  Patient presents with  . Pulmonary Consult    Referred by Angela Number, PA.  She c/o cough x 3 months- occ prod with clear sputum.  Cough is esp worse at night and sometimes wakes her up.    symptoms day = night = throat clearing and noct min productive cough wakes her no better on inhaler or after adding nexium rec Stop lisinopril  Start losartan 100- 25 mg daily in its place Continue nexium Take 30-60 min before first meal of the day and add pepcid ac 20 mg at bedtime until you stop coughing  Prednisone 10 mg take  4 each am x 2 days,   2 each am x 2 days,  1 each am x 2 days and stop    01/12/17 NP ov - 100% better but  Still using tramadol    12/22/2016  f/u ov/Angela Daugherty re:  Cough off acei since last ov but did not follow the other instructions / rec check bp Chief Complaint  Patient presents with  . Follow-up    patient states that she has not been able to get rid of her cough and sneezing.  changed lisinopril  To losartan 100-25 Ran out of nexium one day prior to OV   Not on pepcid  at all Lots of nose runny nose/ sneezing ? On zyrtec rec Take delsym two tsp every 12 hours and supplement if needed with  tramadol 50 mg up to 1  every 4 hours to suppress the urge to cough  Once you have eliminated the cough for 3 straight days try reducing the tramadol first,  then the delsym as tolerated.   For drainage/sneezing  / throat tickle try take CHLORPHENIRAMINE  4 mg - take one every 4 hours as  needed - available over the counter- may cause drowsiness so start with just a bedtime dose or two and see how you tolerate it before trying in daytime   Add pepcid 20 mg (famotidine)  on at bedtime until return     01/30/2017  f/u ov/Angela Daugherty re: cough since summer 2018 only gone while on tramadol  Chief Complaint  Patient presents with  . Follow-up    Increased cough x 2 wks- esp worse at night. She is out of her albuterol inhaler.    cold air makes her cough / not clear saba helped but not taking any more Cough worse at hs then settles down over night p h1 at hs  Not limited by breathing from desired activities   Not clear she understands how/ when to use her meds   No obvious day to day or daytime variability or assoc excess/ purulent sputum or mucus plugs or hemoptysis or cp or chest tightness, subjective wheeze or overt sinus or hb symptoms. No unusual exposure hx or h/o childhood pna/ asthma or knowledge of premature birth.  Sleeping ok p settles down- able to lie  flat  without nocturnal  or early am exacerbation  of respiratory  c/o's or need for noct saba. Also denies any obvious fluctuation of symptoms with weather or environmental changes or other aggravating or alleviating factors except as outlined above   Current Allergies, Complete Past Medical History, Past Surgical History, Family History, and Social History were reviewed in [facility] record.  ROS  The following are not active complaints unless bolded Hoarseness, sore throat, dysphagia, dental problems, itching, sneezing,  nasal congestion or discharge of excess mucus or purulent secretions, ear ache,   fever, chills, sweats, unintended wt loss or wt gain, classically pleuritic or exertional cp,  orthopnea pnd or leg swelling, presyncope, palpitations, abdominal pain, anorexia, nausea, vomiting, diarrhea  or change in bowel habits or change in bladder habits, change in stools or change in urine, dysuria,  hematuria,  rash, arthralgias, visual complaints, headache, numbness, weakness or ataxia or problems with walking or coordination,  change in mood/affect or memory.        Current Meds - not able to verify approp use   Medication Sig  . amLODipine (NORVASC) 10 MG tablet Take 10 mg by mouth daily.  . Carbinoxamine Maleate (RYVENT) 6 MG TABS Take 6 mg by mouth every 6 (six) hours as needed.  Marland Kitchen esomeprazole (NEXIUM) 40 MG capsule Take 30-60 min before first meal of the day  . famotidine (PEPCID) 20 MG tablet One at bedtime  . losartan-hydrochlorothiazide (HYZAAR) 100-25 MG tablet Take 1 tablet by mouth daily.  . metFORMIN (GLUCOPHAGE) 500 MG tablet Take 500 mg by mouth 2 (two) times daily.  . montelukast (SINGULAIR) 10 MG tablet Take 1 tablet (10 mg total) by mouth at bedtime.  . traMADol (ULTRAM) 50 MG tablet 1-2 every 4 hours as needed for cough or pain  . [DISCONTINUED] albuterol (PROAIR HFA) 108 (90 Base) MCG/ACT inhaler Inhale 2 puffs into the lungs every 6 (six) hours as needed for wheezing or shortness of breath.               Objective:   Physical Exam    amb bf  Nad/ no cough during interview or exam    01/30/2017      197   12/22/2016     189   12/12/16 195 lb (88.5 kg)  09/02/16 194 lb 9.6 oz (88.3 kg)  11/01/14 217 lb (98.4 kg)    Vital signs reviewed - Note on arrival 02 sats  100%   HEENT: nl dentition, turbinates bilaterally, and oropharynx. Nl external ear canals without cough reflex   NECK :  without JVD/Nodes/TM/ nl carotid upstrokes bilaterally   LUNGS: no acc muscle use,  Nl contour chest which is clear to A and P bilaterally without cough on insp or exp maneuvers   CV:  RRR  no s3 or murmur or increase in P2, and no edema   ABD:  soft and nontender with nl inspiratory excursion in the supine position. No bruits or organomegaly appreciated, bowel sounds nl  MS:  Nl gait/ ext warm without deformities, calf tenderness, cyanosis or clubbing No obvious  joint restrictions   SKIN: warm and dry without lesions    NEURO:  alert, approp, nl sensorium with  no motor or cerebellar deficits apparent.          Assessment:

## 2017-01-31 ENCOUNTER — Encounter: Payer: Self-pay | Admitting: Internal Medicine

## 2017-01-31 LAB — RESPIRATORY ALLERGY PROFILE REGION II ~~LOC~~
ALLERGEN, D PTERNOYSSINUS, D1: 0.46 kU/L — AB
ALLERGEN, MULBERRY, T70: 0.11 kU/L — AB
Allergen, Cedar tree, t12: 0.38 kU/L — ABNORMAL HIGH
Allergen, Comm Silver Birch, t9: 10.4 kU/L — ABNORMAL HIGH
Allergen, Cottonwood, t14: 0.97 kU/L — ABNORMAL HIGH
Allergen, Mouse Urine Protein, e78: <0.1 kU/L
Allergen, Oak,t7: 8.42 kU/L — ABNORMAL HIGH
BOX ELDER: 1 kU/L — AB
Bermuda Grass: 5.57 kU/L — ABNORMAL HIGH
CLASS: 0
CLASS: 0
CLASS: 0
CLASS: 0
CLASS: 0
CLASS: 1
CLASS: 2
CLASS: 2
CLASS: 2
CLASS: 3
CLASS: 3
CLASS: 3
COMMON RAGWEED (SHORT) (W1) IGE: 0.71 kU/L — AB
Cat Dander: 0.19 kU/L — ABNORMAL HIGH
Class: 0
Class: 0
Class: 0
Class: 0
Class: 0
Class: 1
Class: 1
Class: 2
Class: 2
Class: 3
Class: 3
Class: 4
Cockroach: 0.12 kU/L — ABNORMAL HIGH
D. FARINAE: 0.14 kU/L — AB
Dog Dander: 0.11 kU/L — ABNORMAL HIGH
Elm IgE: 1.01 kU/L — ABNORMAL HIGH
IgE (Immunoglobulin E), Serum: 195 kU/L — ABNORMAL HIGH (ref <=114)
Johnson Grass: 6.24 kU/L — ABNORMAL HIGH
PECAN/HICKORY TREE IGE: 3.51 kU/L — AB
ROUGH PIGWEED IGE: 0.53 kU/L — AB
Sheep Sorrel IgE: 0.97 kU/L — ABNORMAL HIGH
Timothy Grass: 29.8 kU/L — ABNORMAL HIGH

## 2017-01-31 LAB — INTERPRETATION:

## 2017-01-31 NOTE — Assessment & Plan Note (Addendum)
Spirometry 09/02/2016  Nl with min curvature  -  Try off acei 12/12/2016  -  No better 12/22/2016 so rec max rx for gerd/ 1st gen H1 blockers per guidelines  And cyclical cough regimen> resolved just while on tramadol  - FENO 01/30/2017  =   5  -  Allergy profile 01/30/2017 >  Eos 0.3 /  IgE   - 01/30/2017  Due to hs cough rec h1 and h2 one hour before hs and pred x 6 days but no more tramadol to judge response s suppressing the cough   Still strongly support here dx of Upper airway cough syndrome (previously labeled PNDS),  is so named because it's frequently impossible to sort out how much is  CR/sinusitis with freq throat clearing (which can be related to primary GERD)   vs  causing  secondary (" extra esophageal")  GERD from wide swings in gastric pressure that occur with throat clearing, often  promoting self use of mint and menthol lozenges that reduce the lower esophageal sphincter tone and exacerbate the problem further in a cyclical fashion.   These are the same pts (now being labeled as having "irritable larynx syndrome" by some cough centers) who not infrequently have a history of having failed to tolerate ace inhibitors,  dry powder inhalers or biphosphonates or report having atypical/extraesophageal reflux symptoms that don't respond to standard doses of PPI  and are easily confused as having aecopd or asthma flares by even experienced allergists/ pulmonologists (myself included).    Needs to complete the w/u with allergy profile and may need trial off losartan as there have been reports of this particular arb assoc with cough.  Also probably needs Methacholine challenge then gabapentin trial if MCT neg      I had an extended discussion with the patient reviewing all relevant studies completed to date and  lasting 15 to 20 minutes of a 25 minute visit    Discussed in detail all the  indications, usual  risks and alternatives  relative to the benefits with patient who agrees to proceed  with w/u as outlined.  Each maintenance medication was reviewed in detail including most importantly the difference between maintenance and prns and under what circumstances the prns are to be triggered using an action plan format that is not reflected in the computer generated alphabetically organized AVS.    Please see AVS for specific instructions unique to this visit that I personally wrote and verbalized to the the pt in detail and then reviewed with pt  by my nurse highlighting any  changes in therapy recommended at today's visit to their plan of care.

## 2017-02-03 ENCOUNTER — Telehealth: Payer: Self-pay | Admitting: Internal Medicine

## 2017-02-03 NOTE — Telephone Encounter (Signed)
Per MW- okay to overbook at [facility] with the pt and notified of appt time and she was okay with this  I blocked the 3:15 per MW req

## 2017-02-07 ENCOUNTER — Ambulatory Visit: Payer: Medicare Other | Admitting: Internal Medicine

## 2017-02-07 ENCOUNTER — Encounter: Payer: Self-pay | Admitting: Internal Medicine

## 2017-02-07 ENCOUNTER — Ambulatory Visit (INDEPENDENT_AMBULATORY_CARE_PROVIDER_SITE_OTHER): Payer: Medicare Other | Admitting: Internal Medicine

## 2017-02-07 VITALS — BP 110/72 | HR 87 | Ht 64.0 in | Wt 191.8 lb

## 2017-02-07 DIAGNOSIS — R058 Other specified cough: Secondary | ICD-10-CM

## 2017-02-07 DIAGNOSIS — R05 Cough: Secondary | ICD-10-CM | POA: Diagnosis not present

## 2017-02-07 NOTE — Patient Instructions (Addendum)
For drainage / throat tickle try take CHLORPHENIRAMINE  4 mg - take one every 4 hours as needed - available over the counter- may cause drowsiness so start with just   dose or two one hour before bedtime and see how you tolerate it before trying in daytime    Add pepcid 20 mg one hour before bedtime (we gave you a prescription previously but if you lost it you can get it over the counter or call for another rx )   GERD (REFLUX)  is an extremely common cause of respiratory symptoms just like yours , many times with no obvious heartburn at all.    It can be treated with medication, but also with lifestyle changes including elevation of the head of your bed (ideally with 6 inch  bed blocks),  Smoking cessation, avoidance of late meals, excessive alcohol, and avoid fatty foods, chocolate, peppermint, colas, red wine, and acidic juices such as orange juice.  NO MINT OR MENTHOL PRODUCTS SO NO COUGH DROPS   USE SUGARLESS CANDY INSTEAD (Jolley ranchers or Stover's or Life Savers) or even ice chips will also do - the key is to swallow to prevent all throat clearing. NO OIL BASED VITAMINS - use powdered substitutes.   See Tammy NP w/in 2 weeks or first available  with all your medications, even over the counter meds, separated in two separate bags, the ones you take no matter what vs the ones you stop once you feel better and take only as needed when you feel you need them.   Tammy  will generate for you a new user friendly medication calendar that will put [place] all on the same page re: your medication use.  Late Add:  Gabapentin 100 tid next step and return p on it x 4 weeks to see me

## 2017-02-07 NOTE — Assessment & Plan Note (Addendum)
Spirometry 09/02/2016  Nl with min curvature  -  Try off acei 12/12/2016  -  No better 12/22/2016 so rec max rx for gerd/ 1st gen H1 blockers per guidelines  And cyclical cough regimen> resolved just while on tramadol - FENO 01/30/2017  =   5  -  Allergy profile 01/30/2017 >  Eos 0.3 /  IgE  195  RAST pos ragweed, grass, trees cats and dogs  - 01/30/2017  Due to hs cough rec h1 and h2 one hour before hs and pred x 6 days but no more tramadol to judge response s suppressing the cough  > return 02/07/2017 did not follow instructions  She is again thoroughly confused between maint and prns and names of meds and is not using 1st gen H1 blockers per guidelines  At hs nor H2 hs as rec    Reviewed with pt The standardized cough guidelines published in Chest by Lissa Morales in 2006 are still the best available and consist of a multiple step process (up to 12!) , not a single office visit,  and are intended  to address this problem logically,  with an alogrithm dependent on response to empiric treatment at  each progressive step  to determine a specific diagnosis with  minimal addtional testing needed. Therefore if adherence is an issue or can't be accurately verified,  it's very unlikely the standard evaluation and treatment will be successful here.    Furthermore, response to therapy (other than acute cough suppression, which should only be used short term with avoidance of narcotic containing cough syrups if possible), can be a gradual process for which the patient is not likely to  perceive immediate benefit.   rec add back h1 and h2 hs and then return with all meds in hand using a trust but verify approach to confirm accurate Medication  Reconciliation The principal here is that until we are certain that the  patients are doing what we've asked, it makes no sense to ask them to do more eg trial of gabapentin 100 tid which is the next logical step.   Each maintenance medication was reviewed in detail  including most importantly the difference between maintenance and as needed and under what circumstances the prns are to be used.  Please see AVS for specific  Instructions which are unique to this visit and I personally typed out  which were reviewed in detail in writing with the patient and a copy provided.

## 2017-02-07 NOTE — Progress Notes (Signed)
Subjective:     Patient ID: Angela Daugherty, female   DOB: Jun 25, 1970,    MRN: [id number]    Brief patient profile:  18 yobf never smoker with ? Seasonal rhinitis on allergy shots Dr Angela Daugherty seemed better then ? Intermittent  asthma in her late 20's  maint on singulair/prn saba but much worse symptoms of coughing x summer 2018  referred to pulmonary clinic 12/12/2016 by Dr   Angela Daugherty APP     History of Present Illness  12/12/2016 1st  Pulmonary office visit/ Angela Daugherty   Chief Complaint  Patient presents with  . Pulmonary Consult    Referred by Angela Number, PA.  She c/o cough x 3 months- occ prod with clear sputum.  Cough is esp worse at night and sometimes wakes her up.    symptoms day = night = throat clearing and noct min productive cough wakes her no better on inhaler or after adding nexium rec Stop lisinopril  Start losartan 100- 25 mg daily in its place Continue nexium Take 30-60 min before first meal of the day and add pepcid ac 20 mg at bedtime until you stop coughing  Prednisone 10 mg take  4 each am x 2 days,   2 each am x 2 days,  1 each am x 2 days and stop    01/12/17 NP ov - 100% better but  Still using tramadol    12/22/2016  f/u ov/Angela Daugherty re:  Cough off acei since last ov but did not follow the other instructions / rec check bp Chief Complaint  Patient presents with  . Follow-up    patient states that she has not been able to get rid of her cough and sneezing.  changed lisinopril  To losartan 100-25 Ran out of nexium one day prior to OV   Not on pepcid  at all Lots of nose runny nose/ sneezing ? On zyrtec rec Take delsym two tsp every 12 hours and supplement if needed with  tramadol 50 mg up to 1  every 4 hours to suppress the urge to cough  Once you have eliminated the cough for 3 straight days try reducing the tramadol first,  then the delsym as tolerated.   For drainage/sneezing  / throat tickle try take CHLORPHENIRAMINE  4 mg - take one every 4 hours as  needed - available over the counter- may cause drowsiness so start with just a bedtime dose or two and see how you tolerate it before trying in daytime   Add pepcid 20 mg (famotidine)  on at bedtime until return     01/30/2017  f/u ov/Angela Daugherty re: cough since summer 2018 only gone while on tramadol  Chief Complaint  Patient presents with  . Follow-up    Increased cough x 2 wks- esp worse at night. She is out of her albuterol inhaler.    cold air makes her cough / not clear saba helped but not taking any more Cough worse at hs then settles down over night p h1 at hs  Not limited by breathing from desired activities   Not clear she understands how/ when to use her meds   No obvious day to day or daytime variability or assoc excess/ purulent sputum or mucus plugs or hemoptysis or cp or chest tightness, subjective wheeze or overt sinus or hb symptoms. No unusual exposure hx or h/o childhood pna/ asthma or knowledge of premature birth.  Sleeping ok p settles down- able to lie  flat  without nocturnal  or early am exacerbation  of respiratory  c/o's or need for noct saba. Also denies any obvious fluctuation of symptoms with weather or environmental changes or other aggravating or alleviating factors except as outlined above   Current Allergies, Complete Past Medical History, Past Surgical History, Family History, and Social History were reviewed in [employer] record.  ROS  The following are not active complaints unless bolded Hoarseness, sore throat, dysphagia, dental problems, itching, sneezing,  nasal congestion or discharge of excess mucus or purulent secretions, ear ache,   fever, chills, sweats, unintended wt loss or wt gain, classically pleuritic or exertional cp,  orthopnea pnd or leg swelling, presyncope, palpitations, abdominal pain, anorexia, nausea, vomiting, diarrhea  or change in bowel habits or change in bladder habits, change in stools or change in urine, dysuria,  hematuria,  rash, arthralgias, visual complaints, headache, numbness, weakness or ataxia or problems with walking or coordination,  change in mood/affect or memory.        Current Meds - not able to verify approp use   Medication Sig  . amLODipine (NORVASC) 10 MG tablet Take 10 mg by mouth daily.  . Carbinoxamine Maleate (RYVENT) 6 MG TABS Take 6 mg by mouth every 6 (six) hours as needed.  Marland Kitchen esomeprazole (NEXIUM) 40 MG capsule Take 30-60 min before first meal of the day  . famotidine (PEPCID) 20 MG tablet One at bedtime  . losartan-hydrochlorothiazide (HYZAAR) 100-25 MG tablet Take 1 tablet by mouth daily.  . metFORMIN (GLUCOPHAGE) 500 MG tablet Take 500 mg by mouth 2 (two) times daily.  . montelukast (SINGULAIR) 10 MG tablet Take 1 tablet (10 mg total) by mouth at bedtime.  . traMADol (ULTRAM) 50 MG tablet 1-2 every 4 hours as needed for cough or pain  . [DISCONTINUED] albuterol (PROAIR HFA) 108 (90 Base) MCG/ACT inhaler Inhale 2 puffs into the lungs every 6 (six) hours as needed for wheezing or shortness of breath.               Objective:   Physical Exam    amb bf  Nad/ no cough during interview or exam    01/30/2017      197   12/22/2016     189   12/12/16 195 lb (88.5 kg)  09/02/16 194 lb 9.6 oz (88.3 kg)  11/01/14 217 lb (98.4 kg)    Vital signs reviewed - Note on arrival 02 sats  100%   HEENT: nl dentition, turbinates bilaterally, and oropharynx. Nl external ear canals without cough reflex   NECK :  without JVD/Nodes/TM/ nl carotid upstrokes bilaterally   LUNGS: no acc muscle use,  Nl contour chest which is clear to A and P bilaterally without cough on insp or exp maneuvers   CV:  RRR  no s3 or murmur or increase in P2, and no edema   ABD:  soft and nontender with nl inspiratory excursion in the supine position. No bruits or organomegaly appreciated, bowel sounds nl  MS:  Nl gait/ ext warm without deformities, calf tenderness, cyanosis or clubbing No obvious  joint restrictions   SKIN: warm and dry without lesions    NEURO:  alert, approp, nl sensorium with  no motor or cerebellar deficits apparent.          Assessment:          Subjective:     Patient ID: Angela Daugherty, female   DOB: 07/11/1970,    MRN: [id number]  Brief patient profile:  46 yobf never smoker with ? Seasonal rhinitis on allergy shots Dr Angela Daugherty seemed better then ? Intermittent  asthma in her late 20's  maint on singulair/prn saba but much worse symptoms of coughing x summer 2018  referred to pulmonary clinic 12/12/2016 by Dr   Angela Daugherty APP     History of Present Illness  12/12/2016 1st [facility] office visit/ Angela Daugherty   Chief Complaint  Patient presents with  . Pulmonary Consult    Referred by Angela Number, PA.  She c/o cough x 3 months- occ prod with clear sputum.  Cough is esp worse at night and sometimes wakes her up.    symptoms day = night = throat clearing and noct min productive cough wakes her no better on inhaler or after adding nexium rec Stop lisinopril  Start losartan 100- 25 mg daily in its place Continue nexium Take 30-60 min before first meal of the day and add pepcid ac 20 mg at bedtime until you stop coughing  Prednisone 10 mg take  4 each am x 2 days,   2 each am x 2 days,  1 each am x 2 days and stop    01/12/17 NP ov - 100% better but  Still using tramadol    12/22/2016  f/u ov/Angela Daugherty re:  Cough off acei since last ov but did not follow the other instructions / rec check bp Chief Complaint  Patient presents with  . Follow-up    patient states that she has not been able to get rid of her cough and sneezing.  changed lisinopril  To losartan 100-25 Ran out of nexium one day prior to OV   Not on pepcid  at all Lots of nose runny nose/ sneezing ? On zyrtec rec Take delsym two tsp every 12 hours and supplement if needed with  tramadol 50 mg up to 1  every 4 hours to suppress the urge to cough  Once you have eliminated the cough  for 3 straight days try reducing the tramadol first,  then the delsym as tolerated.   For drainage/sneezing  / throat tickle try take CHLORPHENIRAMINE  4 mg - take one every 4 hours as needed - available over the counter- may cause drowsiness so start with just a bedtime dose or two and see how you tolerate it before trying in daytime   Add pepcid 20 mg (famotidine)  on at bedtime until return     01/30/2017  f/u ov/Angela Daugherty re: cough since summer 2018 only gone while on tramadol  Chief Complaint  Patient presents with  . Follow-up    Increased cough x 2 wks- esp worse at night. She is out of her albuterol inhaler.   cold air makes her cough / not clear saba helped but not taking any more Cough worse at hs then settles down over night p h1 at hs  Not limited by breathing from desired activities   Not clear she understands how/ when to use her meds  rec Please remember to go to the lab department downstairs in the basement  for your tests - we will call you with the results when they are available. See calendar  Prednisone 10 mg take  4 each am x 2 days,   2 each am x 2 days,  1 each am x 2 days and stop       02/07/2017  f/u ov/Angela Daugherty re:  Uacs/ not using h2 hs and using lots of  peppermints / no med calendar  Chief Complaint  Patient presents with  . Follow-up    coughing no better, wakes her up at night.   daytime cough is much improved, now mostly just throat clearing daytime with voice use and noct cough but extinguishes and able to sleep fine s cough  Not limited by breathing from desired activities / no worse off saba     No obvious day to day or daytime variability or assoc excess/ purulent sputum or mucus plugs or hemoptysis or cp or chest tightness, subjective wheeze or overt sinus or hb symptoms. No unusual exposure hx or h/o childhood pna/ asthma or knowledge of premature birth.  Sleeping ok flat once she gets to sleep without nocturnal  or early am exacerbation  of respiratory   c/o's or need for noct saba. Also denies any obvious fluctuation of symptoms with weather or environmental changes or other aggravating or alleviating factors except as outlined above   Current Allergies, Complete Past Medical History, Past Surgical History, Family History, and Social History were reviewed in [employer] record.  ROS  The following are not active complaints unless bolded Hoarseness, sore throat, dysphagia, dental problems, itching, sneezing,  nasal congestion or discharge of excess mucus or purulent secretions, ear ache,   fever, chills, sweats, unintended wt loss or wt gain, classically pleuritic or exertional cp,  orthopnea pnd or leg swelling, presyncope, palpitations, abdominal pain, anorexia, nausea, vomiting, diarrhea  or change in bowel habits or change in bladder habits, change in stools or change in urine, dysuria, hematuria,  rash, arthralgias, visual complaints, headache, numbness, weakness or ataxia or problems with walking or coordination,  change in mood/affect or memory.        Current Meds not able to confirm she takes any of these as listed   Medication Sig  . amLODipine (NORVASC) 10 MG tablet Take 10 mg by mouth daily.  . Carbinoxamine Maleate (RYVENT) 6 MG TABS Take 6 mg by mouth every 6 (six) hours as needed.  . DELSYM 30 MG/5ML liquid Take 15 mg by mouth 2 (two) times daily.  Marland Kitchen esomeprazole (NEXIUM) 40 MG capsule Take 30-60 min before first meal of the day  . famotidine (PEPCID) 20 MG tablet One at bedtime  . losartan-hydrochlorothiazide (HYZAAR) 100-25 MG tablet Take 1 tablet by mouth daily.  . metFORMIN (GLUCOPHAGE) 500 MG tablet Take 500 mg by mouth 2 (two) times daily.  . montelukast (SINGULAIR) 10 MG tablet Take 1 tablet (10 mg total) by mouth at bedtime.  . traMADol (ULTRAM) 50 MG tablet 1-2 every 4 hours as needed for cough or pain  . [DISCONTINUED] predniSONE (DELTASONE) 10 MG tablet Take  4 each am x 2 days,   2 each am x 2  days,  1 each am x 2 days and stop               Objective:   Physical Exam  amb obese bf easily confused with details of care    02/07/2017        191 01/30/2017      197   12/22/2016     189   12/12/16 195 lb (88.5 kg)  09/02/16 194 lb 9.6 oz (88.3 kg)  11/01/14 217 lb (98.4 kg)    Vital signs reviewed - Note on arrival 02 sats  97% on RA    HEENT: nl dentition, turbinates bilaterally, and oropharynx. Nl external ear canals without cough reflex   NECK :  without JVD/Nodes/TM/ nl carotid upstrokes bilaterally   LUNGS: no acc muscle use,  Nl contour chest which is clear to A and P bilaterally without cough on insp or exp maneuvers   CV:  RRR  no s3 or murmur or increase in P2, and no edema   ABD:  soft and nontender with nl inspiratory excursion in the supine position. No bruits or organomegaly appreciated, bowel sounds nl  MS:  Nl gait/ ext warm without deformities, calf tenderness, cyanosis or clubbing No obvious joint restrictions   SKIN: warm and dry without lesions    NEURO:  alert, approp, nl sensorium with  no motor or cerebellar deficits apparent.          Assessment:

## 2017-02-08 ENCOUNTER — Telehealth: Payer: Self-pay | Admitting: Internal Medicine

## 2017-02-08 MED ORDER — FAMOTIDINE 20 MG PO TABS: ORAL_TABLET | ORAL | 2 refills | Status: DC

## 2017-02-08 NOTE — Telephone Encounter (Signed)
Spoke with the pt  She is asking for rx for pepcid b/c she can not find written rx we gave her previously  Rx was sent and nothing further needed per pt

## 2017-02-13 ENCOUNTER — Encounter: Payer: Medicare Other | Admitting: Adult Health

## 2017-03-20 ENCOUNTER — Encounter: Payer: Medicare Other | Admitting: Adult Health

## 2017-03-22 ENCOUNTER — Ambulatory Visit (INDEPENDENT_AMBULATORY_CARE_PROVIDER_SITE_OTHER): Payer: Medicare Other | Admitting: Adult Health

## 2017-03-22 ENCOUNTER — Encounter: Payer: Self-pay | Admitting: Adult Health

## 2017-03-22 DIAGNOSIS — R058 Other specified cough: Secondary | ICD-10-CM

## 2017-03-22 DIAGNOSIS — J309 Allergic rhinitis, unspecified: Secondary | ICD-10-CM

## 2017-03-22 DIAGNOSIS — K219 Gastro-esophageal reflux disease without esophagitis: Secondary | ICD-10-CM

## 2017-03-22 DIAGNOSIS — R05 Cough: Secondary | ICD-10-CM

## 2017-03-22 NOTE — Progress Notes (Signed)
@Patient  ID: Angela Daugherty, female    DOB: 1970/10/12, 47 y.o.   MRN: [id number]  Chief Complaint  Patient presents with  . Follow-up    cough     Referring provider: Benito Mccreedy, MD  HPI: 47 year old female never smoker seen for pulmonary consult December 12, 2016 for cough and allergies. Possible Ace Inhibitor cough   Test  Spirometry 09/02/2016  Nl with min curvature  -  Try off acei 12/12/2016  -  No better 12/22/2016 so rec max rx for gerd/ 1st gen H1 blockers per guidelines  And cyclical cough regimen> resolved just while on tramadol - FENO 01/30/2017  =   5  -  Allergy profile 01/30/2017 >  Eos 0.3 /  IgE  195  RAST pos ragweed, grass, trees cats and dogs  - 01/30/2017  Due to hs cough rec h1 and h2 one hour before hs and pred x 6 days but no more tramadol to judge response s suppressing the cough  > return 02/07/2017 did not follow instructions   03/22/2017 Follow up : Chronic Cough  Patient presents for a one-month follow-up.  Patient has been followed for chronic cough that began in the summer 2018.  She was taken off her ACE inhibitor for possible ACE related cough.  She did not see significant improvement in symptoms.  Nexium and Pepcid were added for GERD control.  Chlor-Trimeton was added for postnasal drip trigger.  She was given Delsym and tramadol for cough suppression.  Patient had some improvement but not resolution of cough.  She was given a prednisone taper with minimal improvement.. Last visit patient was recommended to use Chlor-Trimeton every 4 hours as needed for throat clearing.  Patient says her cough is improved but not totally resolved.  She has taken Chlor-Trimeton at bedtime.  Has a hard time taken during the daytime due to daytime sleepiness.  She is not taking Delsym or Tessalon Perles.  Explained to her that she needs to use her cough suppression medicines to help with her cough control.  We reviewed all her medications went over patient instructions  with patient education. She denies any hemoptysis chest pain orthopnea PND or leg swelling.  Previous workup showed a normal spirometry.  Chest x-ray with some  increased interstitial markings.  Exhaled nitric oxide testing normal.  Allergy profile did show an elevated IgE at 195.  Allergy panel was positive for ragweed grass trees cats and dogs..  No Known Allergies  Immunization History  Administered Date(s) Administered  . Influenza,inj,Quad PF,6+ Mos 01/12/2017  . Td 08/24/1999    Past Medical History:  Diagnosis Date  . Asthma   . Hypertension     Tobacco History: Social History   Tobacco Use  Smoking Status Never Smoker  Smokeless Tobacco Never Used   Counseling given: Not Answered   Outpatient Encounter Medications as of 03/22/2017  Medication Sig  . chlorpheniramine (CHLOR-TRIMETON) 4 MG tablet Take 4 mg by mouth 2 (two) times daily as needed for allergies.  Marland Kitchen esomeprazole (NEXIUM) 40 MG capsule Take 30-60 min before first meal of the day  . famotidine (PEPCID) 20 MG tablet One at bedtime  . losartan-hydrochlorothiazide (HYZAAR) 100-25 MG tablet Take 1 tablet by mouth daily.  . metFORMIN (GLUCOPHAGE) 500 MG tablet Take 500 mg by mouth 2 (two) times daily.  . montelukast (SINGULAIR) 10 MG tablet Take 1 tablet (10 mg total) by mouth at bedtime.  Marland Kitchen amLODipine (NORVASC) 10 MG tablet Take 10  mg by mouth daily.  . Carbinoxamine Maleate (RYVENT) 6 MG TABS Take 6 mg by mouth every 6 (six) hours as needed. (Patient not taking: Reported on 03/22/2017)  . DELSYM 30 MG/5ML liquid Take 15 mg by mouth 2 (two) times daily.  . traMADol (ULTRAM) 50 MG tablet 1-2 every 4 hours as needed for cough or pain (Patient not taking: Reported on 03/22/2017)   No facility-administered encounter medications on file as of 03/22/2017.      Review of Systems  Constitutional:   No  weight loss, night sweats,  Fevers, chills, fatigue, or  lassitude.  HEENT:   No headaches,  Difficulty swallowing,   Tooth/dental problems, or  Sore throat,                No sneezing, itching, ear ache,  +nasal congestion, post nasal drip,   CV:  No chest pain,  Orthopnea, PND, swelling in lower extremities, anasarca, dizziness, palpitations, syncope.   GI  No heartburn, indigestion, abdominal pain, nausea, vomiting, diarrhea, change in bowel habits, loss of appetite, bloody stools.   Resp:    No chest wall deformity  Skin: no rash or lesions.  GU: no dysuria, change in color of urine, no urgency or frequency.  No flank pain, no hematuria   MS:  No joint pain or swelling.  No decreased range of motion.  No back pain.    Physical Exam  BP 132/70 (BP Location: Left Arm, Cuff Size: Normal)   Pulse 75   Ht 5\' 4"  (1.626 m)   Wt 196 lb (88.9 kg)   LMP 07/17/2012   SpO2 100%   BMI 33.64 kg/m   GEN: A/Ox3; pleasant , NAD , obese    HEENT:  /AT,  EACs-clear, TMs-wnl, NOSE-clear, THROAT-clear, no lesions, no postnasal drip or exudate noted.   NECK:  Supple w/ fair ROM; no JVD; normal carotid impulses w/o bruits; no thyromegaly or nodules palpated; no lymphadenopathy.    RESP  Clear  P & A; w/o, wheezes/ rales/ or rhonchi. no accessory muscle use, no dullness to percussion  CARD:  RRR, no m/r/g, no peripheral edema, pulses intact, no cyanosis or clubbing.  GI:   Soft & nt; nml bowel sounds; no organomegaly or masses detected.   Musco: Warm bil, no deformities or joint swelling noted.   Neuro: alert, no focal deficits noted.    Skin: Warm, no lesions or rashes    Lab Results:  CBC  BNP No results found for: BNP  ProBNP No results found for: PROBNP  Imaging: No results found.   Assessment & Plan:   Upper airway cough syndrome Slowly improving with treatment aimed at cough suppression ,and trigger control  Will continue on Singulair and Chlortrimeton . May need to add Zyrtec or Allegra for daytime symptoms if not able to tolerate Chlortrimeton .  Cont on PPI /Pepcid.    Restart Jaci Standard Lavella Lemons .  If not resolving may need .full PFT w/ DLCO and HRCT to further evluate ongoing cough.    GERD Cont on PPI/pepcid  GERD diet   Allergic rhinitis Cont on Singulair and Chlortrimeton      Tammy Parrett, NP 03/22/2017

## 2017-03-22 NOTE — Assessment & Plan Note (Signed)
Cont on Singulair and Chlortrimeton

## 2017-03-22 NOTE — Assessment & Plan Note (Signed)
Slowly improving with treatment aimed at cough suppression ,and trigger control  Will continue on Singulair and Chlortrimeton . May need to add Zyrtec or Allegra for daytime symptoms if not able to tolerate Chlortrimeton .  Cont on PPI /Pepcid.  Restart Jaci Standard Lavella Lemons .  If not resolving may need .full PFT w/ DLCO and HRCT to further evluate ongoing cough.

## 2017-03-22 NOTE — Patient Instructions (Signed)
Follow med calendar closely and bring to each visit .  May sure to take Delsym and Tessalon to prevent coughing .  Follow up with Dr. Melvyn Novas  In  3 months and As needed

## 2017-03-22 NOTE — Progress Notes (Signed)
Chart and office note reviewed in detail  > agree with a/p as outlined    

## 2017-03-22 NOTE — Assessment & Plan Note (Signed)
Cont on PPI/pepcid  GERD diet

## 2017-03-24 MED ORDER — BENZONATATE 200 MG PO CAPS: 200.0000 mg | ORAL_CAPSULE | Freq: Three times a day (TID) | ORAL | 3 refills | Status: DC | PRN

## 2017-03-24 NOTE — Addendum Note (Signed)
Addended by: Parke Poisson E on: 03/24/2017 01:13 PM   Modules accepted: Orders

## 2017-03-29 NOTE — Addendum Note (Signed)
Addended by: Parke Poisson E on: 03/29/2017 08:53 AM   Modules accepted: Orders

## 2017-04-24 DIAGNOSIS — K219 Gastro-esophageal reflux disease without esophagitis: Secondary | ICD-10-CM | POA: Diagnosis not present

## 2017-04-24 DIAGNOSIS — E119 Type 2 diabetes mellitus without complications: Secondary | ICD-10-CM | POA: Diagnosis not present

## 2017-04-24 DIAGNOSIS — I10 Essential (primary) hypertension: Secondary | ICD-10-CM | POA: Diagnosis not present

## 2017-04-24 DIAGNOSIS — J3089 Other allergic rhinitis: Secondary | ICD-10-CM | POA: Diagnosis not present

## 2017-04-24 DIAGNOSIS — Z124 Encounter for screening for malignant neoplasm of cervix: Secondary | ICD-10-CM | POA: Diagnosis not present

## 2017-04-24 DIAGNOSIS — R3915 Urgency of urination: Secondary | ICD-10-CM | POA: Diagnosis not present

## 2017-04-24 DIAGNOSIS — N926 Irregular menstruation, unspecified: Secondary | ICD-10-CM | POA: Diagnosis not present

## 2017-04-24 DIAGNOSIS — E1140 Type 2 diabetes mellitus with diabetic neuropathy, unspecified: Secondary | ICD-10-CM | POA: Diagnosis not present

## 2017-04-24 DIAGNOSIS — J45909 Unspecified asthma, uncomplicated: Secondary | ICD-10-CM | POA: Diagnosis not present

## 2017-05-05 NOTE — Telephone Encounter (Signed)
Patient has been seen by MW will close message.

## 2017-05-17 ENCOUNTER — Other Ambulatory Visit: Payer: Self-pay | Admitting: Internal Medicine

## 2017-06-20 ENCOUNTER — Ambulatory Visit: Payer: Medicare Other | Admitting: Internal Medicine

## 2017-07-03 ENCOUNTER — Other Ambulatory Visit: Payer: Self-pay | Admitting: Internal Medicine

## 2017-07-03 MED ORDER — LOSARTAN POTASSIUM-HCTZ 100-25 MG PO TABS: 1.0000 | ORAL_TABLET | Freq: Every day | ORAL | 1 refills | Status: DC

## 2017-07-04 DIAGNOSIS — M25561 Pain in right knee: Secondary | ICD-10-CM | POA: Diagnosis not present

## 2017-07-04 DIAGNOSIS — E1140 Type 2 diabetes mellitus with diabetic neuropathy, unspecified: Secondary | ICD-10-CM | POA: Diagnosis not present

## 2017-07-04 DIAGNOSIS — J45909 Unspecified asthma, uncomplicated: Secondary | ICD-10-CM | POA: Diagnosis not present

## 2017-07-04 DIAGNOSIS — J3089 Other allergic rhinitis: Secondary | ICD-10-CM | POA: Diagnosis not present

## 2017-07-04 DIAGNOSIS — I10 Essential (primary) hypertension: Secondary | ICD-10-CM | POA: Diagnosis not present

## 2017-07-04 DIAGNOSIS — K219 Gastro-esophageal reflux disease without esophagitis: Secondary | ICD-10-CM | POA: Diagnosis not present

## 2017-07-04 DIAGNOSIS — E119 Type 2 diabetes mellitus without complications: Secondary | ICD-10-CM | POA: Diagnosis not present

## 2017-07-13 DIAGNOSIS — I10 Essential (primary) hypertension: Secondary | ICD-10-CM | POA: Diagnosis not present

## 2017-07-13 DIAGNOSIS — L239 Allergic contact dermatitis, unspecified cause: Secondary | ICD-10-CM | POA: Diagnosis not present

## 2017-07-13 DIAGNOSIS — J3089 Other allergic rhinitis: Secondary | ICD-10-CM | POA: Diagnosis not present

## 2017-07-13 DIAGNOSIS — E119 Type 2 diabetes mellitus without complications: Secondary | ICD-10-CM | POA: Diagnosis not present

## 2017-07-13 DIAGNOSIS — J45909 Unspecified asthma, uncomplicated: Secondary | ICD-10-CM | POA: Diagnosis not present

## 2017-07-13 DIAGNOSIS — M25561 Pain in right knee: Secondary | ICD-10-CM | POA: Diagnosis not present

## 2017-07-13 DIAGNOSIS — E1140 Type 2 diabetes mellitus with diabetic neuropathy, unspecified: Secondary | ICD-10-CM | POA: Diagnosis not present

## 2017-07-13 DIAGNOSIS — K219 Gastro-esophageal reflux disease without esophagitis: Secondary | ICD-10-CM | POA: Diagnosis not present

## 2017-07-24 DIAGNOSIS — M1711 Unilateral primary osteoarthritis, right knee: Secondary | ICD-10-CM | POA: Diagnosis not present

## 2017-07-26 ENCOUNTER — Encounter: Payer: Medicare Other | Attending: Physician Assistant | Admitting: *Deleted

## 2017-07-26 DIAGNOSIS — E119 Type 2 diabetes mellitus without complications: Secondary | ICD-10-CM

## 2017-07-26 DIAGNOSIS — Z6834 Body mass index (BMI) 34.0-34.9, adult: Secondary | ICD-10-CM | POA: Insufficient documentation

## 2017-07-26 DIAGNOSIS — I10 Essential (primary) hypertension: Secondary | ICD-10-CM | POA: Insufficient documentation

## 2017-07-26 DIAGNOSIS — Z713 Dietary counseling and surveillance: Secondary | ICD-10-CM | POA: Insufficient documentation

## 2017-07-26 DIAGNOSIS — E781 Pure hyperglyceridemia: Secondary | ICD-10-CM | POA: Insufficient documentation

## 2017-07-26 DIAGNOSIS — E669 Obesity, unspecified: Secondary | ICD-10-CM | POA: Diagnosis not present

## 2017-07-26 DIAGNOSIS — K219 Gastro-esophageal reflux disease without esophagitis: Secondary | ICD-10-CM | POA: Insufficient documentation

## 2017-07-26 NOTE — Patient Instructions (Signed)
Plan:  Aim for 3 Carb Choices per meal +/- 1 either way  Aim for 0-2 Carbs per snack if hungry  Include protein in moderation with your meals and snacks Consider trying a new vegetable every once in awhile Continue with your activity level by walking for 20 minutes daily as tolerated Continue taking medication as directed by MD

## 2017-07-26 NOTE — Progress Notes (Signed)
Diabetes Self-Management Education  Visit Type: First/Initial  Appt. Start Time: 1400 Appt. End Time: 3662  07/26/2017  [name], identified by name and date of birth, is a 47 y.o. female with a diagnosis of Diabetes: Type 2. Patient states her sister helps her with her diabetes and drives her to appointments. She states she lives with a friend and states several times during the visit that he criticizes her food choices. She states she walks every day for about 20 minutes based on a walking APP on her phone. She states she is afraid of her diabetes and has several family members that have died from it. She does not check her BG, but her A1c is 5.6% so probably doesn't need to at this time.   ASSESSMENT  Height 5\' 4"  (1.626 m), weight 202 lb (91.6 kg), last menstrual period 07/17/2012. Body mass index is 34.67 kg/m.  Diabetes Self-Management Education - 07/26/17 1420      Visit Information   Visit Type  First/Initial      Initial Visit   Diabetes Type  Type 2    Are you currently following a meal plan?  No    Are you taking your medications as prescribed?  Yes    Date Diagnosed  2018      Health Coping   How would you rate your overall health?  Fair      Psychosocial Assessment   Patient Belief/Attitude about Diabetes  Other (comment) upset    Self-care barriers  Low literacy    Self-management support  Friends;Family    Other persons present  Patient    Patient Concerns  Nutrition/Meal planning    Special Needs  Simplified materials    Learning Readiness  Ready    How often do you need to have someone help you when you read instructions, pamphlets, or other written materials from your doctor or pharmacy?  3 - Sometimes    What is the last grade level you completed in school?  12      Pre-Education Assessment   Patient understands the diabetes disease and treatment process.  Needs Instruction    Patient understands incorporating nutritional management into  lifestyle.  Needs Instruction    Patient undertands incorporating physical activity into lifestyle.  Needs Review    Patient understands using medications safely.  Needs Instruction    Patient understands monitoring blood glucose, interpreting and using results  Needs Instruction    Patient understands prevention, detection, and treatment of acute complications.  Needs Instruction    Patient understands prevention, detection, and treatment of chronic complications.  Needs Instruction    Patient understands how to develop strategies to address psychosocial issues.  Needs Instruction    Patient understands how to develop strategies to promote health/change behavior.  Needs Instruction      Complications   Last HgB A1C per patient/outside source  5.6 %    How often do you check your blood sugar?  Not recommended by provider    Have you had a dilated eye exam in the past 12 months?  Yes    Have you had a dental exam in the past 12 months?  No    Are you checking your feet?  Yes    How many days per week are you checking your feet?  7      Dietary Intake   Breakfast  skips about 4 days a week OR sandwich OR sweetened cereal with 2% milk  Lunch  skips often too - OR sandwich and unsalted chips OR     Snack (afternoon)  ice cream out of the carton [place]  she prepares a burger on bun or spaghetti with crackers    Snack (evening)  popcorn    Beverage(s)  Kool-aid with sugar in it, occasionally water      Exercise   Exercise Type  Light (walking / raking leaves)    How many days per week to you exercise?  7    How many minutes per day do you exercise?  60    Total minutes per week of exercise  420      Patient Education   Previous Diabetes Education  No    Disease state   Factors that contribute to the development of diabetes    Nutrition management   Carbohydrate counting    Physical activity and exercise   Role of exercise on diabetes management, blood pressure control  and cardiac health.    Medications  Reviewed patients medication for diabetes, action, purpose, timing of dose and side effects.    Chronic complications  Relationship between chronic complications and blood glucose control      Individualized Goals (developed by patient)   Nutrition  Follow meal plan discussed    Physical Activity  Exercise 3-5 times per week    Medications  take my medication as prescribed    Monitoring   Not Applicable      Post-Education Assessment   Patient understands incorporating nutritional management into lifestyle.  Demonstrates understanding / competency    Patient undertands incorporating physical activity into lifestyle.  Demonstrates understanding / competency    Patient understands using medications safely.  Demonstrates understanding / competency      Outcomes   Expected Outcomes  Demonstrated interest in learning. Expect positive outcomes    Future DMSE  PRN    Program Status  Completed       Individualized Plan for Diabetes Self-Management Training:   Learning Objective:  Patient will have a greater understanding of diabetes self-management. Patient education plan is to attend individual and/or group sessions per assessed needs and concerns.   Plan:   Patient Instructions  Plan:  Aim for 3 Carb Choices per meal +/- 1 either way  Aim for 0-2 Carbs per snack if hungry  Include protein in moderation with your meals and snacks Consider trying a new vegetable every once in awhile Continue with your activity level by walking for 20 minutes daily as tolerated, GREAT JOB! Continue taking medication as directed by MD  Expected Outcomes:  Demonstrated interest in learning. Expect positive outcomes  Education material provided: A1C conversion sheet, Meal plan card and Carbohydrate counting sheet  If problems or questions, patient to contact team via:  Phone  Future DSME appointment: PRN

## 2017-08-17 DIAGNOSIS — J3089 Other allergic rhinitis: Secondary | ICD-10-CM | POA: Diagnosis not present

## 2017-08-17 DIAGNOSIS — J45909 Unspecified asthma, uncomplicated: Secondary | ICD-10-CM | POA: Diagnosis not present

## 2017-08-17 DIAGNOSIS — L239 Allergic contact dermatitis, unspecified cause: Secondary | ICD-10-CM | POA: Diagnosis not present

## 2017-08-17 DIAGNOSIS — E119 Type 2 diabetes mellitus without complications: Secondary | ICD-10-CM | POA: Diagnosis not present

## 2017-08-17 DIAGNOSIS — R05 Cough: Secondary | ICD-10-CM | POA: Diagnosis not present

## 2017-08-17 DIAGNOSIS — E1140 Type 2 diabetes mellitus with diabetic neuropathy, unspecified: Secondary | ICD-10-CM | POA: Diagnosis not present

## 2017-08-17 DIAGNOSIS — I10 Essential (primary) hypertension: Secondary | ICD-10-CM | POA: Diagnosis not present

## 2017-08-17 DIAGNOSIS — M25561 Pain in right knee: Secondary | ICD-10-CM | POA: Diagnosis not present

## 2017-08-17 DIAGNOSIS — K219 Gastro-esophageal reflux disease without esophagitis: Secondary | ICD-10-CM | POA: Diagnosis not present

## 2017-08-23 ENCOUNTER — Ambulatory Visit: Payer: Medicare Other | Admitting: Internal Medicine

## 2017-08-24 ENCOUNTER — Encounter: Payer: Self-pay | Admitting: Internal Medicine

## 2017-08-24 ENCOUNTER — Ambulatory Visit (INDEPENDENT_AMBULATORY_CARE_PROVIDER_SITE_OTHER): Payer: Medicare Other | Admitting: Internal Medicine

## 2017-08-24 ENCOUNTER — Telehealth: Payer: Self-pay | Admitting: Internal Medicine

## 2017-08-24 VITALS — BP 124/82 | HR 69 | Ht 64.0 in | Wt 204.0 lb

## 2017-08-24 DIAGNOSIS — R05 Cough: Secondary | ICD-10-CM

## 2017-08-24 DIAGNOSIS — R058 Other specified cough: Secondary | ICD-10-CM

## 2017-08-24 MED ORDER — TRAMADOL HCL 50 MG PO TABS: 50.0000 mg | ORAL_TABLET | ORAL | 0 refills | Status: DC | PRN

## 2017-08-24 MED ORDER — DELSYM 30 MG/5ML PO SUER: 60.0000 mg | Freq: Two times a day (BID) | ORAL | Status: DC

## 2017-08-24 MED ORDER — ESOMEPRAZOLE MAGNESIUM 40 MG PO CPDR: DELAYED_RELEASE_CAPSULE | ORAL | 2 refills | Status: DC

## 2017-08-24 MED ORDER — CHLORPHENIRAMINE MALEATE 4 MG PO TABS: 4.0000 mg | ORAL_TABLET | ORAL | Status: DC | PRN

## 2017-08-24 MED ORDER — FAMOTIDINE 20 MG PO TABS: ORAL_TABLET | ORAL | 2 refills | Status: DC

## 2017-08-24 NOTE — Assessment & Plan Note (Signed)
Body mass index is 35.02 kg/m.  -  trending up  Lab Results  Component Value Date   TSH 0.755 06/10/2009     Contributing to gerd risk/ doe/reviewed the need and the process to achieve and maintain neg calorie balance > defer f/u primary care including intermittently monitoring thyroid status

## 2017-08-24 NOTE — Telephone Encounter (Signed)
Spoke with Rosann Auerbach at [facility]. I have advised her of the pt's diagnosis. Nothing further was needed.

## 2017-08-24 NOTE — Progress Notes (Signed)
Subjective:     Patient ID: [name], female   DOB: September 30, 1970,    MRN: [id number]    Brief patient profile:  27 yobf never smoker with ? Seasonal rhinitis on allergy shots Dr Shara Blazing seemed better then ? Intermittent  asthma in her late 20's  maint on singulair/prn saba but much worse symptoms of coughing x summer 2018  referred to pulmonary clinic 12/12/2016 by Dr   Barnie Alderman APP     History of Present Illness  12/12/2016 1st [facility] office visit/ Demetries Coia   Chief Complaint  Patient presents with  . Pulmonary Consult    Referred by Raelyn Number, PA.  She c/o cough x 3 months- occ prod with clear sputum.  Cough is esp worse at night and sometimes wakes her up.    symptoms day = night = throat clearing and noct min productive cough wakes her no better on inhaler or after adding nexium rec Stop lisinopril  Start losartan 100- 25 mg daily in its place Continue nexium Take 30-60 min before first meal of the day and add pepcid ac 20 mg at bedtime until you stop coughing  Prednisone 10 mg take  4 each am x 2 days,   2 each am x 2 days,  1 each am x 2 days and stop    01/12/17 NP ov - 100% better but  Still using tramadol    12/22/2016  f/u ov/Ramond Darnell re:  Cough off acei since last ov but did not follow the other instructions / rec check bp Chief Complaint  Patient presents with  . Follow-up    patient states that she has not been able to get rid of her cough and sneezing.  changed lisinopril  To losartan 100-25 Ran out of nexium one day prior to OV   Not on pepcid  at all Lots of nose runny nose/ sneezing ? On zyrtec rec Take delsym two tsp every 12 hours and supplement if needed with  tramadol 50 mg up to 1  every 4 hours to suppress the urge to cough  Once you have eliminated the cough for 3 straight days try reducing the tramadol first,  then the delsym as tolerated.   For drainage/sneezing  / throat tickle try take CHLORPHENIRAMINE  4 mg - take one every 4  hours as needed - available over the counter- may cause drowsiness so start with just a bedtime dose or two and see how you tolerate it before trying in daytime   Add pepcid 20 mg (famotidine)  on at bedtime until return     01/30/2017  f/u ov/Jilda Kress re: cough since summer 2018 only gone while on tramadol  Chief Complaint  Patient presents with  . Follow-up    Increased cough x 2 wks- esp worse at night. She is out of her albuterol inhaler.   cold air makes her cough / not clear saba helped but not taking any more Cough worse at hs then settles down over night p h1 at hs  Not limited by breathing from desired activities   Not clear she understands how/ when to use her meds  rec Please remember to go to the lab department downstairs in the basement  for your tests - we will call you with the results when they are available. See calendar  Prednisone 10 mg take  4 each am x 2 days,   2 each am x 2 days,  1 each am  x 2 days and stop       02/07/2017  f/u ov/Saphia Vanderford re:  Uacs/ not using h2 hs and using lots of peppermints / no med calendar  Chief Complaint  Patient presents with  . Follow-up    coughing no better, wakes her up at night.   daytime cough is much improved, now mostly just throat clearing daytime with voice use and noct cough but extinguishes and able to sleep fine s cough rec For drainage / throat tickle try take CHLORPHENIRAMINE  4 mg -  Add pepcid 20 mg one hour before bedtime (we gave you a prescription previously but if you lost it you can get it over the counter or call for another rx )  GERD  diet    03/12/17 NP for med calendar> return with med calendar in hand   08/24/2017 acute extended ov/Johncarlos Holtsclaw re: cough despite singulair/zyrtec maint rx Chief Complaint  Patient presents with  . Acute Visit    Increased cough x 2 months- occ prod with clear sputum.   living with cats and dogs outside  Cough was some better then worse esp at hs  p stopped nexium/ pepcid/ h1  Dry  day throat clearing and noct coughing fits disrupting sleep. Not limited by breathing from desired activities  But very sedentary   Confused again with meds/ no med cal/ says had MCT at Dr Pricilla Loveless office last week with no new recs other than to return here   No obvious day to day or daytime variability or assoc excess/ purulent sputum or mucus plugs or hemoptysis or cp or chest tightness, subjective wheeze or overt sinus or hb symptoms. No unusual exposure hx or h/o childhood pna/ asthma or knowledge of premature birth.    Also denies any obvious fluctuation of symptoms with weather or environmental changes or other aggravating or alleviating factors except as outlined above   Current Allergies, Complete Past Medical History, Past Surgical History, Family History, and Social History were reviewed in [facility] record.  ROS  The following are not active complaints unless bolded Hoarseness, sore throat, dysphagia, dental problems, itching, sneezing,  nasal congestion or discharge of excess mucus or purulent secretions, ear ache,   fever, chills, sweats, unintended wt loss or wt gain, classically pleuritic or exertional cp,  orthopnea pnd or arm/hand swelling  or leg swelling, presyncope, palpitations, abdominal pain, anorexia, nausea, vomiting, diarrhea  or change in bowel habits or change in bladder habits, change in stools or change in urine, dysuria, hematuria,  rash, arthralgias, visual complaints, headache, numbness, weakness or ataxia or problems with walking or coordination,  change in mood or  memory.        Current Meds  Medication Sig  . cetirizine (ZYRTEC) 10 MG tablet Take 10 mg by mouth daily.  Marland Kitchen losartan-hydrochlorothiazide (HYZAAR) 100-25 MG tablet Take 1 tablet by mouth daily.  . metFORMIN (GLUCOPHAGE) 500 MG tablet Take 500 mg by mouth 2 (two) times daily.  . montelukast (SINGULAIR) 10 MG tablet Take 1 tablet (10 mg total) by mouth at bedtime.                            Objective:   Physical Exam  amb obese bf nad    08/24/2017        204  02/07/2017        191 01/30/2017      197   12/22/2016  189   12/12/16 195 lb (88.5 kg)  09/02/16 194 lb 9.6 oz (88.3 kg)  11/01/14 217 lb (98.4 kg)    Vital signs reviewed - Note on arrival 02 sats  98% on RA        08/24/2017 HEENT: nl dentition, turbinates bilaterally, and oropharynx. Nl external ear canals without cough reflex   NECK :  without JVD/Nodes/TM/ nl carotid upstrokes bilaterally   LUNGS: no acc muscle use,  Nl contour chest which is clear to A and P bilaterally without cough on insp or exp maneuvers   CV:  RRR  no s3 or murmur or increase in P2, and no edema   ABD:  soft and nontender with nl inspiratory excursion in the supine position. No bruits or organomegaly appreciated, bowel sounds nl  MS:  Nl gait/ ext warm without deformities, calf tenderness, cyanosis or clubbing No obvious joint restrictions   SKIN: warm and dry without lesions    NEURO:  alert, approp, nl sensorium with  no motor or cerebellar deficits apparent.         Assessment:

## 2017-08-24 NOTE — Patient Instructions (Signed)
Start back on nexium 40 mg  Take  30-60 min before first meal of the day and Pepcid (famotidine)  20 mg one @  bedtime until return to office - this is the best way to tell whether stomach acid is contributing to your problem.  For drainage / throat tickle try take CHLORPHENIRAMINE  4 mg - take one every 4 hours as needed - available over the counter- may cause drowsiness so start with just a bedtime dose or two and see how you tolerate it before trying in daytime    Take delsym two tsp every 12 hours and supplement if needed with  tramadol 50 mg up to 2 every 4 hours to suppress the urge to cough. Swallowing water and/or using ice chips/non mint and menthol containing candies (such as lifesavers or sugarless jolly ranchers) are also effective.  You should rest your voice and avoid activities that you know make you cough.  Once you have eliminated the cough for 3 straight days try reducing the tramadol first,  then the delsym as tolerated.     Please see patient coordinator before you leave today  to schedule Methacholine challenge test  See calendar for specific medication instructions and bring it back for each and every office visit for every healthcare provider you see.  Without it,  you may not receive the best quality medical care that we feel you deserve.  You will note that the calendar groups together  your maintenance  medications that are timed at particular times of the day.  Think of this as your checklist for what your doctor has instructed you to do until your next evaluation to see what benefit  there is  to staying on a consistent group of medications intended to keep you well.  The other group at the bottom is entirely up to you to use as you see fit  for specific symptoms that may arise between visits that require you to treat them on an as needed basis.  Think of this as your action plan or "what if" list.   Separating the top medications from the bottom group is fundamental to  providing you adequate care going forward.    Please schedule a follow up office visit in 6 weeks, call sooner if needed with all medications /inhalers/ solutions in hand so we can verify exactly what you are taking. This includes all medications from all doctors and over the counters

## 2017-08-24 NOTE — Assessment & Plan Note (Addendum)
Onset summer 2018 Spirometry 09/02/2016  Nl with min curvature  -  Try off acei 12/12/2016  -  No better 12/22/2016 so rec max rx for gerd/ 1st gen H1 blockers per guidelines  And cyclical cough regimen> resolved just while on tramadol - FENO 01/30/2017  =   5  -  Allergy profile 01/30/2017 >  Eos 0.3 /  IgE  195  RAST pos ragweed, grass, trees cats and dogs  - 01/30/2017  Due to hs cough rec h1 and h2 one hour before hs and pred x 6 days but no more tramadol to judge response s suppressing the cough  > return 02/07/2017 did not follow instructions  - 08/24/2017 worse off gerd rx and h1 > resume and schedule MCT    Almost certainly this is uacs ? Related to pnds > resume max rx as before but this time do MCT (very strongly done in private clinic in [place] but happy to review what they did do there)   Upper airway cough syndrome (previously labeled PNDS),  is so named because it's frequently impossible to sort out how much is  CR/sinusitis with freq throat clearing (which can be related to primary GERD)   vs  causing  secondary (" extra esophageal")  GERD from wide swings in gastric pressure that occur with throat clearing, often  promoting self use of mint and menthol lozenges that reduce the lower esophageal sphincter tone and exacerbate the problem further in a cyclical fashion.   These are the same pts (now being labeled as having "irritable larynx syndrome" by some cough centers) who not infrequently have a history of having failed to tolerate ace inhibitors,  dry powder inhalers or biphosphonates or report having atypical/extraesophageal reflux symptoms that don't respond to standard doses of PPI  and are easily confused as having aecopd or asthma flares by even experienced allergists/ pulmonologists (myself included).   Of the three most common causes of  Sub-acute / recurrent or chronic cough, only one (GERD)  can actually contribute to/ trigger  the other two (asthma and post nasal drip syndrome)   and perpetuate the cylce of cough.  While not intuitively obvious, many patients with chronic low grade reflux do not cough until there is a primary insult that disturbs the protective epithelial barrier and exposes sensitive nerve endings.   This is typically viral but can due to PNDS and  either may apply here.   The point is that once this occurs, it is difficult to eliminate the cycle  using anything but a maximally effective acid suppression regimen at least in the short run, accompanied by an appropriate diet to address non acid GERD and control / eliminate the cough itself for at least 3 days with tramadol and regroup in 6 weeks with mct first then consider adding gabapentin rx    I had an extended discussion with the patient reviewing all relevant studies completed to date and  lasting 25 minutes of a 40  minute acute office visit  re  severe non-specific but potentially very serious refractory respiratory symptoms of uncertain and potentially multiple  etiologies.     Each maintenance medication was reviewed in detail including most importantly the difference between maintenance and as needed and under what circumstances the prns are to be used. This was done in the context of generating and presenting her with a new medication calendar review which provided the patient with a user-friendly unambiguous mechanism for medication administration and reconciliation and  provides an action plan for all active problems. It is critical that this be shown to every doctor  for modification during the office visit if necessary so the patient can use it as a working document.       Please see also  AVS for specific instructions unique to this office visit that I personally wrote and verbalized to the the pt in detail and then reviewed with pt  by my nurse highlighting any changes in therapy/plan of care  recommended at today's visit.

## 2017-08-25 ENCOUNTER — Ambulatory Visit: Payer: Medicare Other | Admitting: Pulmonary Disease

## 2017-09-04 ENCOUNTER — Ambulatory Visit (HOSPITAL_COMMUNITY)
Admission: RE | Admit: 2017-09-04 | Discharge: 2017-09-04 | Disposition: A | Discharge status: Home / Self Care | Payer: Medicare Other | Source: Ambulatory Visit | Attending: Internal Medicine | Admitting: Internal Medicine

## 2017-09-04 DIAGNOSIS — R05 Cough: Secondary | ICD-10-CM | POA: Diagnosis not present

## 2017-09-04 DIAGNOSIS — J449 Chronic obstructive pulmonary disease, unspecified: Secondary | ICD-10-CM | POA: Insufficient documentation

## 2017-09-04 DIAGNOSIS — R058 Other specified cough: Secondary | ICD-10-CM

## 2017-09-04 DIAGNOSIS — R918 Other nonspecific abnormal finding of lung field: Secondary | ICD-10-CM | POA: Insufficient documentation

## 2017-09-04 LAB — PULMONARY FUNCTION TEST
FEF 25-75 POST: 2.04 L/s
FEF 25-75 PRE: 1.78 L/s
FEF2575-%Change-Post: 14 %
FEF2575-%Pred-Post: 89 %
FEF2575-%Pred-Pre: 78 %
FEV1-%Change-Post: 1 %
FEV1-%PRED-POST: 113 %
FEV1-%PRED-PRE: 111 %
FEV1-POST: 2.36 L
FEV1-Pre: 2.31 L
FEV1FVC-%Change-Post: 9 %
FEV1FVC-%PRED-PRE: 91 %
FEV6-%Change-Post: -7 %
FEV6-%PRED-POST: 113 %
FEV6-%PRED-PRE: 122 %
FEV6-POST: 2.86 L
FEV6-Pre: 3.08 L
FEV6FVC-%PRED-POST: 103 %
FEV6FVC-%Pred-Pre: 103 %
FVC-%CHANGE-POST: -7 %
FVC-%Pred-Post: 110 %
FVC-%Pred-Pre: 119 %
FVC-Post: 2.86 L
FVC-Pre: 3.08 L
POST FEV6/FVC RATIO: 100 %
PRE FEV1/FVC RATIO: 75 %
PRE FEV6/FVC RATIO: 100 %
Post FEV1/FVC ratio: 82 %

## 2017-09-04 MED ORDER — METHACHOLINE 0.0625 MG/ML NEB SOLN
2.0000 mL | Freq: Once | RESPIRATORY_TRACT | Status: AC
  Administered 2017-09-04: 0.125 mg via RESPIRATORY_TRACT

## 2017-09-04 MED ORDER — ALBUTEROL SULFATE (2.5 MG/3ML) 0.083% IN NEBU
2.5000 mg | INHALATION_SOLUTION | Freq: Once | RESPIRATORY_TRACT | Status: AC
  Administered 2017-09-04: 2.5 mg via RESPIRATORY_TRACT

## 2017-09-04 MED ORDER — METHACHOLINE 4 MG/ML NEB SOLN
2.0000 mL | Freq: Once | RESPIRATORY_TRACT | Status: AC
  Administered 2017-09-04: 8 mg via RESPIRATORY_TRACT

## 2017-09-04 MED ORDER — SODIUM CHLORIDE 0.9 % IN NEBU
3.0000 mL | INHALATION_SOLUTION | Freq: Once | RESPIRATORY_TRACT | Status: AC
  Administered 2017-09-04: 3 mL via RESPIRATORY_TRACT

## 2017-09-04 MED ORDER — METHACHOLINE 0.25 MG/ML NEB SOLN
2.0000 mL | Freq: Once | RESPIRATORY_TRACT | Status: AC
  Administered 2017-09-04: 0.5 mg via RESPIRATORY_TRACT

## 2017-09-04 MED ORDER — METHACHOLINE 1 MG/ML NEB SOLN
2.0000 mL | Freq: Once | RESPIRATORY_TRACT | Status: AC
  Administered 2017-09-04: 2 mg via RESPIRATORY_TRACT

## 2017-09-04 MED ORDER — METHACHOLINE 16 MG/ML NEB SOLN
2.0000 mL | Freq: Once | RESPIRATORY_TRACT | Status: AC
  Administered 2017-09-04: 32 mg via RESPIRATORY_TRACT

## 2017-09-05 ENCOUNTER — Telehealth: Payer: Self-pay | Admitting: Internal Medicine

## 2017-09-05 ENCOUNTER — Encounter: Payer: Self-pay | Admitting: Internal Medicine

## 2017-09-05 DIAGNOSIS — J45991 Cough variant asthma: Secondary | ICD-10-CM | POA: Insufficient documentation

## 2017-09-05 NOTE — Telephone Encounter (Signed)
Attempted to call patient today regarding results. I did not receive an answer at time of call. I have left a voicemail message for pt to return call. X1  

## 2017-09-05 NOTE — Progress Notes (Signed)
LMTCB

## 2017-09-06 NOTE — Telephone Encounter (Signed)
Move up to next available with all meds in hand

## 2017-09-06 NOTE — Telephone Encounter (Signed)
ATC pt, no answer. Left message for pt to call back.  

## 2017-09-06 NOTE — Telephone Encounter (Signed)
Tanda Rockers, MD  Rosana Berger, CMA        Call patient : Angela Daugherty is c/w mild asthma and only occurred at highest dose but it may be that's the cause of her symptoms so needs to set up appt next available to discuss management options but no need to change rx in meantime - bring all meds    Pt is aware of results and voiced her understanding. Pt has pending apt for med calender on 10/05/17.  MW please advise if apt should be sooner. Thanks

## 2017-09-08 NOTE — Telephone Encounter (Signed)
Per chart, Ria Comment left a message for patient to call back to get her appt rescheduled.

## 2017-09-11 NOTE — Telephone Encounter (Signed)
Spoke with pt, scheduled to see MW tomorrow at 3:45.  Nothing further needed.

## 2017-09-12 ENCOUNTER — Ambulatory Visit: Payer: Medicare Other | Admitting: Internal Medicine

## 2017-10-02 DIAGNOSIS — L239 Allergic contact dermatitis, unspecified cause: Secondary | ICD-10-CM | POA: Diagnosis not present

## 2017-10-02 DIAGNOSIS — J3089 Other allergic rhinitis: Secondary | ICD-10-CM | POA: Diagnosis not present

## 2017-10-02 DIAGNOSIS — I10 Essential (primary) hypertension: Secondary | ICD-10-CM | POA: Diagnosis not present

## 2017-10-02 DIAGNOSIS — E1140 Type 2 diabetes mellitus with diabetic neuropathy, unspecified: Secondary | ICD-10-CM | POA: Diagnosis not present

## 2017-10-02 DIAGNOSIS — K219 Gastro-esophageal reflux disease without esophagitis: Secondary | ICD-10-CM | POA: Diagnosis not present

## 2017-10-02 DIAGNOSIS — Z0000 Encounter for general adult medical examination without abnormal findings: Secondary | ICD-10-CM | POA: Diagnosis not present

## 2017-10-02 DIAGNOSIS — M25561 Pain in right knee: Secondary | ICD-10-CM | POA: Diagnosis not present

## 2017-10-02 DIAGNOSIS — E119 Type 2 diabetes mellitus without complications: Secondary | ICD-10-CM | POA: Diagnosis not present

## 2017-10-02 DIAGNOSIS — J45909 Unspecified asthma, uncomplicated: Secondary | ICD-10-CM | POA: Diagnosis not present

## 2017-10-05 ENCOUNTER — Encounter: Payer: Medicare Other | Admitting: Internal Medicine

## 2017-10-09 ENCOUNTER — Encounter: Payer: Self-pay | Admitting: Internal Medicine

## 2017-10-09 ENCOUNTER — Telehealth: Payer: Self-pay | Admitting: Internal Medicine

## 2017-10-09 ENCOUNTER — Ambulatory Visit (INDEPENDENT_AMBULATORY_CARE_PROVIDER_SITE_OTHER): Payer: Medicare Other | Admitting: Internal Medicine

## 2017-10-09 VITALS — BP 122/90 | HR 79 | Ht 64.0 in | Wt 207.0 lb

## 2017-10-09 DIAGNOSIS — J45991 Cough variant asthma: Secondary | ICD-10-CM

## 2017-10-09 DIAGNOSIS — R05 Cough: Secondary | ICD-10-CM | POA: Diagnosis not present

## 2017-10-09 DIAGNOSIS — R058 Other specified cough: Secondary | ICD-10-CM

## 2017-10-09 MED ORDER — GABAPENTIN 100 MG PO CAPS: 100.0000 mg | ORAL_CAPSULE | Freq: Four times a day (QID) | ORAL | 3 refills | Status: DC

## 2017-10-09 MED ORDER — GABAPENTIN 100 MG PO CAPS: 100.0000 mg | ORAL_CAPSULE | Freq: Four times a day (QID) | ORAL | 3 refills | Status: AC

## 2017-10-09 NOTE — Telephone Encounter (Signed)
Rx has been resent with the correct sig. Nothing further was needed.

## 2017-10-09 NOTE — Telephone Encounter (Signed)
Pt's Gabapentin prescription was sent to the pharamcy with 2 sigs >> 1 QID and 1 TID.  MW - please clarify how the pt should be taking this. Thanks.

## 2017-10-09 NOTE — Telephone Encounter (Signed)
One qid (that's why there are 120 per month on the #)

## 2017-10-09 NOTE — Progress Notes (Signed)
Subjective:     Patient ID: Angela Daugherty, female   DOB: 09-08-1970,    MRN: [id number]    Brief patient profile:  30 yobf never smoker with ? Seasonal rhinitis on allergy shots Dr Shara Blazing seemed better then ? Intermittent  asthma in her late 20's  maint on singulair/prn saba but much worse symptoms of coughing x summer 2018  referred to pulmonary clinic 12/12/2016 by Dr   Barnie Alderman APP     History of Present Illness  12/12/2016 1st [facility] office visit/ Derrian Rodak   Chief Complaint  Patient presents with  . Pulmonary Consult    Referred by Raelyn Number, PA.  She c/o cough x 3 months- occ prod with clear sputum.  Cough is esp worse at night and sometimes wakes her up.    symptoms day = night = throat clearing and noct min productive cough wakes her no better on inhaler or after adding nexium rec Stop lisinopril  Start losartan 100- 25 mg daily in its place Continue nexium Take 30-60 min before first meal of the day and add pepcid ac 20 mg at bedtime until you stop coughing  Prednisone 10 mg take  4 each am x 2 days,   2 each am x 2 days,  1 each am x 2 days and stop    01/12/17 NP ov - 100% better but  Still using tramadol    12/22/2016  f/u ov/Quenton Recendez re:  Cough off acei since last ov but did not follow the other instructions / rec check bp Chief Complaint  Patient presents with  . Follow-up    patient states that she has not been able to get rid of her cough and sneezing.  changed lisinopril  To losartan 100-25 Ran out of nexium one day prior to OV   Not on pepcid  at all Lots of nose runny nose/ sneezing ? On zyrtec rec Take delsym two tsp every 12 hours and supplement if needed with  tramadol 50 mg up to 1  every 4 hours to suppress the urge to cough  Once you have eliminated the cough for 3 straight days try reducing the tramadol first,  then the delsym as tolerated.   For drainage/sneezing  / throat tickle try take CHLORPHENIRAMINE  4 mg - take one every 4  hours as needed - available over the counter- may cause drowsiness so start with just a bedtime dose or two and see how you tolerate it before trying in daytime   Add pepcid 20 mg (famotidine)  on at bedtime until return     01/30/2017  f/u ov/Karla Pavone re: cough since summer 2018 only gone while on tramadol  Chief Complaint  Patient presents with  . Follow-up    Increased cough x 2 wks- esp worse at night. She is out of her albuterol inhaler.   cold air makes her cough / not clear saba helped but not taking any more Cough worse at hs then settles down over night p h1 at hs  Not limited by breathing from desired activities   Not clear she understands how/ when to use her meds  rec Please remember to go to the lab department downstairs in the basement  for your tests - we will call you with the results when they are available. See calendar  Prednisone 10 mg take  4 each am x 2 days,   2 each am x 2 days,  1 each am  x 2 days and stop       02/07/2017  f/u ov/Paulett Kaufhold re:  Uacs/ not using h2 hs and using lots of peppermints / no med calendar  Chief Complaint  Patient presents with  . Follow-up    coughing no better, wakes her up at night.   daytime cough is much improved, now mostly just throat clearing daytime with voice use and noct cough but extinguishes and able to sleep fine s cough rec For drainage / throat tickle try take CHLORPHENIRAMINE  4 mg -  Add pepcid 20 mg one hour before bedtime (we gave you a prescription previously but if you lost it you can get it over the counter or call for another rx )  GERD  diet    03/12/17 NP for med calendar> return with med calendar in hand   08/24/2017 acute extended ov/Maisa Bedingfield re: cough despite singulair/zyrtec maint rx Chief Complaint  Patient presents with  . Acute Visit    Increased cough x 2 months- occ prod with clear sputum.   living with cats and dogs outside  Cough was some better then worse esp at hs  p stopped nexium/ pepcid/ h1  Dry  day throat clearing and noct coughing fits disrupting sleep. Not limited by breathing from desired activities  But very sedentary   Confused again with meds/ no med cal/ says had MCT at Dr Pricilla Loveless office last week with no new recs other than to return here  rec Start back on nexium 40 mg  Take  30-60 min before first meal of the day and Pepcid (famotidine)  20 mg one @  bedtime until return to office - this is the best way to tell whether stomach acid is contributing to your problem. For drainage / throat tickle try take CHLORPHENIRAMINE  4 mg - take one every 4 hours as needed -  Take delsym two tsp every 12 hours and supplement if needed with  tramadol 50 mg up to 2 every 4 hours to suppress the urge to cough.  Once you have eliminated the cough for 3 straight days try reducing the tramadol first,  then the delsym as tolerated.   Please see patient coordinator before you leave today  to schedule Methacholine challenge test    MCT  09/04/17 > pos at highest dose > caused chest tightness and some cough but did not really reproduce her symptoms.     10/09/2017  f/u ov/Abygale Karpf re: chronic  Cough since summer 2018 neg response to prednisone/ maint on singualir  Chief Complaint  Patient presents with  . Follow-up    Breathing is doing well. She is coughing more- relates to anxiety after her sister's death.   Dyspnea:  Not limited by breathing from desired activities   Cough: sporadic day and noct non prod Sleeping: variably coughs  Taking 3 different forms of antihistamines    No obvious day to day or daytime variability or assoc excess/ purulent sputum or mucus plugs or hemoptysis or cp or chest tightness, subjective wheeze or overt sinus or hb symptoms.    Also denies any obvious fluctuation of symptoms with weather or environmental changes or other aggravating or alleviating factors except as outlined above   No unusual exposure hx or h/o childhood pna/ asthma or knowledge of premature  birth.  Current Allergies, Complete Past Medical History, Past Surgical History, Family History, and Social History were reviewed in [employer] record.  ROS  The following are not  active complaints unless bolded Hoarseness, sore throat, dysphagia, dental problems, itching, sneezing,  nasal congestion or discharge of excess mucus or purulent secretions, ear ache,   fever, chills, sweats, unintended wt loss or wt gain, classically pleuritic or exertional cp,  orthopnea pnd or arm/hand swelling  or leg swelling, presyncope, palpitations, abdominal pain, anorexia, nausea, vomiting, diarrhea  or change in bowel habits or change in bladder habits, change in stools or change in urine, dysuria, hematuria,  rash, arthralgias, visual complaints, headache, numbness, weakness or ataxia or problems with walking or coordination,  change in mood or  memory.        Current Meds  Medication Sig  . esomeprazole (NEXIUM) 40 MG capsule Take 30-60 min before first meal of the day  . famotidine (PEPCID) 20 MG tablet One at bedtime  . hydrOXYzine (ATARAX/VISTARIL) 25 MG tablet Take 25 mg by mouth 3 (three) times daily as needed.  Marland Kitchen losartan-hydrochlorothiazide (HYZAAR) 100-25 MG tablet Take 1 tablet by mouth daily.  . metFORMIN (GLUCOPHAGE) 500 MG tablet Take 500 mg by mouth 2 (two) times daily.  . montelukast (SINGULAIR) 10 MG tablet Take 1 tablet (10 mg total) by mouth at bedtime.  . [  chlorpheniramine (CHLOR-TRIMETON) 4 MG tablet Take 1 tablet (4 mg total) by mouth every 4 (four) hours as needed for allergies. 1 tab by mouth at bedtime.  May take 2 extra every 4 hours as needed                        Objective:   Physical Exam  amb obese bf nad   10/09/2017          207  08/24/2017        204  02/07/2017        191 01/30/2017      197   12/22/2016     189   12/12/16 195 lb (88.5 kg)  09/02/16 194 lb 9.6 oz (88.3 kg)  11/01/14 217 lb (98.4 kg)    Vital signs reviewed - Note  on arrival 02 sats  99% on RA       HEENT: nl dentition, turbinates bilaterally, and oropharynx. Nl external ear canals without cough reflex   NECK :  without JVD/Nodes/TM/ nl carotid upstrokes bilaterally   LUNGS: no acc muscle use,  Nl contour chest which is clear to A and P bilaterally without cough on insp or exp maneuvers   CV:  RRR  no s3 or murmur or increase in P2, and no edema   ABD:  soft and nontender with nl inspiratory excursion in the supine position. No bruits or organomegaly appreciated, bowel sounds nl  MS:  Nl gait/ ext warm without deformities, calf tenderness, cyanosis or clubbing No obvious joint restrictions   SKIN: warm and dry without lesions    NEURO:  alert, approp, nl sensorium with  no motor or cerebellar deficits apparent.         Assessment:

## 2017-10-09 NOTE — Patient Instructions (Signed)
Stop zyrtec and chlorpheniramine   Increase hydroxyzine to 25 mg four times a day   Gabapentin 100 mg four times daily as per med calendar   See Tammy NP w/in 2 weeks with all your medications, even over the counter meds, separated in two separate bags, the ones you take no matter what vs the ones you stop once you feel better and take only as needed when you feel you need them.   Tammy  will generate for you a new user friendly medication calendar that will put [place] all on the same page re: your medication use.     Without this process, it simply isn't possible to assure that we are providing  your outpatient care  with  the attention to detail we feel you deserve.   If we cannot assure that you're getting that kind of care,  then we cannot manage your problem effectively from this clinic.  Once you have seen Tammy and we are sure that we're all on the same page with your medication use she will arrange follow up with me.

## 2017-10-11 ENCOUNTER — Encounter: Payer: Self-pay | Admitting: Internal Medicine

## 2017-10-11 NOTE — Assessment & Plan Note (Signed)
Onset summer 2018 Spirometry 09/02/2016  Nl with min curvature  -  Try off acei 12/12/2016  -  No better 12/22/2016 so rec max rx for gerd/ 1st gen H1 blockers per guidelines  And cyclical cough regimen> resolved just while on tramadol - FENO 01/30/2017  =   5  -  Allergy profile 01/30/2017 >  Eos 0.3 /  IgE  195  RAST pos ragweed, grass, trees cats and dogs  - 01/30/2017  Due to hs cough rec h1 and h2 one hour before hs and pred x 6 days but no more tramadol to judge response s suppressing the cough  > return 02/07/2017 did not follow instructions  - 08/24/2017 worse off gerd rx and h1 > resume do  MCT 09/04/17 > pos at highest dose but did not reproduce symptoms  - trial of gabapentin 100 qid and consolidate H1 to hydroxyzine 25 mg qid    Lack of cough resolution on a verified empirical regimen could mean an alternative diagnosis(irritable larynx) , persistence of the disease state (eg sinusitis or bronchiectasis) , or inadequacy of currently available therapy (eg no medical rx available for non-acid gerd - always a concern in cyclical cough syndrome)   rec max rx for irritable larynx/ gerd and f/u for med reconciliation in one month then consider titrating up the gabapentin and sinus ct to complete the w/u - consider also referral to [facility]/ Dr Joya Gaskins   I had an extended discussion with the patient reviewing all relevant studies completed to date and  lasting 15 to 20 minutes of a 25 minute visit    Each maintenance medication was reviewed in detail including most importantly the difference between maintenance and prns and under what circumstances the prns are to be triggered using an action plan format that is not reflected in the computer generated alphabetically organized AVS but trather by a customized med calendar that reflects the AVS meds with confirmed 100% correlation.   In addition, Please see AVS for unique instructions that I personally wrote and verbalized to the the pt in detail and then  reviewed with pt  by my nurse highlighting any  changes in therapy recommended at today's visit to their plan of care.

## 2017-10-11 NOTE — Assessment & Plan Note (Signed)
Body mass index is 35.53 kg/m.  -  trending up  Lab Results  Component Value Date   TSH 0.755 06/10/2009     Contributing to gerd risk/ doe/reviewed the need and the process to achieve and maintain neg calorie balance > defer f/u primary care including intermittently monitoring thyroid status

## 2017-10-11 NOTE — Assessment & Plan Note (Signed)
MCT  09/04/17 > pos at highest dose but did not reproduce symtpoms  So rec rx as uacs (see separate a/p)

## 2017-10-30 ENCOUNTER — Encounter: Payer: Medicare Other | Admitting: Adult Health

## 2017-10-31 ENCOUNTER — Encounter: Payer: Medicare Other | Admitting: Adult Health

## 2017-11-02 ENCOUNTER — Ambulatory Visit (INDEPENDENT_AMBULATORY_CARE_PROVIDER_SITE_OTHER): Payer: Medicare Other | Admitting: Adult Health

## 2017-11-02 ENCOUNTER — Encounter: Payer: Self-pay | Admitting: Adult Health

## 2017-11-02 DIAGNOSIS — J309 Allergic rhinitis, unspecified: Secondary | ICD-10-CM | POA: Diagnosis not present

## 2017-11-02 DIAGNOSIS — K219 Gastro-esophageal reflux disease without esophagitis: Secondary | ICD-10-CM

## 2017-11-02 DIAGNOSIS — R05 Cough: Secondary | ICD-10-CM | POA: Diagnosis not present

## 2017-11-02 DIAGNOSIS — R058 Other specified cough: Secondary | ICD-10-CM

## 2017-11-02 MED ORDER — HYDROXYZINE HCL 25 MG PO TABS: 25.0000 mg | ORAL_TABLET | Freq: Four times a day (QID) | ORAL | 3 refills | Status: DC

## 2017-11-02 NOTE — Assessment & Plan Note (Signed)
Cont on current regimen  

## 2017-11-02 NOTE — Patient Instructions (Signed)
Follow med calendar closely and bring to each visit .  Go pick up prescriptions when able.  Follow up with Dr. Melvyn Novas  In  6-8 weeks  and As needed   Please contact office for sooner follow up if symptoms do not improve or worsen or seek emergency care

## 2017-11-02 NOTE — Addendum Note (Signed)
Addended by: Parke Poisson E on: 11/02/2017 10:45 AM   Modules accepted: Orders

## 2017-11-02 NOTE — Assessment & Plan Note (Addendum)
Appears to be improving on current regimen  Pt education on meds and sedating effects, caution advised.  Med cost remain issue. Pt education given  Patient's medications were reviewed today and patient education was given. Computerized medication calendar was adjusted/completed  If cough persists may want to consider HRCT chest , along with spirometry w/ DLCO .   Plan  Patient Instructions  Follow med calendar closely and bring to each visit .  Go pick up prescriptions when able.  Follow up with Dr. Melvyn Novas  In  6-8 weeks  and As needed   Please contact office for sooner follow up if symptoms do not improve or worsen or seek emergency care

## 2017-11-02 NOTE — Assessment & Plan Note (Signed)
Restart nexium and pepcid -to see if helps possible GERD related cough

## 2017-11-02 NOTE — Progress Notes (Signed)
Chart and office note reviewed in detail  > agree with a/p as outlined    

## 2017-11-02 NOTE — Progress Notes (Signed)
@Patient  ID: Angela Daugherty, female    DOB: 03-17-1970, 47 y.o.   MRN: [id number]  Chief Complaint  Patient presents with  . Follow-up    Asthma     Referring provider: Benito Mccreedy, MD  HPI: 84 -year old female never smoker seen for pulmonary consult December 12, 2016 for cough and allergies. Tx for possible Ace Inhibitor cough   Test /Events  Spirometry 09/02/2016  Nl with min curvature  -  Try off acei 12/12/2016  -  No better 12/22/2016 so rec max rx for gerd/ 1st gen H1 blockers per guidelines  And cyclical cough regimen> resolved just while on tramadol - FENO 01/30/2017  =   5  -  Allergy profile 01/30/2017 >  Eos 0.3 /  IgE  195  RAST pos ragweed, grass, trees cats and dogs  - 01/30/2017  Due to hs cough rec h1 and h2 one hour before hs and pred x 6 days but no more tramadol to judge response s suppressing the cough  > return 02/07/2017 did not follow instructions  -MCT  09/04/17 > pos at highest dose > caused chest tightness and some cough but did not really reproduce her symptoms.   11/02/2017 Follow up: Chronic cough/Cough variant asthma  /Med Review  Pt returns for 2 week follow up. She has been evaluated and treated for a difficult to control chronic cough /Cough variant asthma. Methacholine challenge test last month showed mild asthma response. Cough has been refractory to prednisone . Last visit changed to hydroxizine and gabapentin for ongoing post nasal drainage, throat tickle., refractory cough .   Marland Kitchen Continued on empiric GERD treatment .  Since last ov she is feeling better. Cough is decreased, about 50% better.   We reviewed all her meds and organized them into a med calendar . She is not taking nexium, pepcid or singulair . Is trying to get enough money to go pick them up at pharmacy .  She has medicaid , rx cost 2-3$ each .   Under a lot of stress , sister passed away .    No Known Allergies  Immunization History  Administered Date(s) Administered  .  Influenza,inj,Quad PF,6+ Mos 01/12/2017  . Td 08/24/1999    Past Medical History:  Diagnosis Date  . Asthma   . Hypertension     Tobacco History: Social History   Tobacco Use  Smoking Status Never Smoker  Smokeless Tobacco Never Used   Counseling given: Not Answered   Outpatient Medications Prior to Visit  Medication Sig Dispense Refill  . esomeprazole (NEXIUM) 40 MG capsule Take 30-60 min before first meal of the day 30 capsule 2  . famotidine (PEPCID) 20 MG tablet One at bedtime 30 tablet 2  . gabapentin (NEURONTIN) 100 MG capsule Take 1 capsule (100 mg total) by mouth 4 (four) times daily. 120 capsule 3  . hydrOXYzine (ATARAX/VISTARIL) 25 MG tablet Take 25 mg by mouth 3 (three) times daily as needed.    Marland Kitchen losartan-hydrochlorothiazide (HYZAAR) 100-25 MG tablet Take 1 tablet by mouth daily. 90 tablet 1  . metFORMIN (GLUCOPHAGE) 500 MG tablet Take 500 mg by mouth 2 (two) times daily.    . montelukast (SINGULAIR) 10 MG tablet Take 1 tablet (10 mg total) by mouth at bedtime. 30 tablet 5   No facility-administered medications prior to visit.      Review of Systems  Constitutional:   No  weight loss, night sweats,  Fevers, chills, fatigue, or  lassitude.  HEENT:   No headaches,  Difficulty swallowing,  Tooth/dental problems, or  Sore throat,                No sneezing, itching, ear ache,  +nasal congestion, post nasal drip, +throat clearing   CV:  No chest pain,  Orthopnea, PND, swelling in lower extremities, anasarca, dizziness, palpitations, syncope.   GI  No heartburn, indigestion, abdominal pain, nausea, vomiting, diarrhea, change in bowel habits, loss of appetite, bloody stools.   Resp:  .  No chest wall deformity  Skin: no rash or lesions.  GU: no dysuria, change in color of urine, no urgency or frequency.  No flank pain, no hematuria   MS:  No joint pain or swelling.  No decreased range of motion.  No back pain.    Physical Exam  BP 120/88 (BP Location:  Left Arm, Cuff Size: Normal)   Pulse 70   Ht 5' 4.5" (1.638 m)   Wt 219 lb 3.2 oz (99.4 kg)   LMP 07/17/2012   SpO2 99%   BMI 37.04 kg/m   GEN: A/Ox3; pleasant , NAD, obese    HEENT:  [place]/AT,  EACs-clear, TMs-wnl, NOSE-clear, THROAT-clear, no lesions, no postnasal drip or exudate noted. Poor dentition   NECK:  Supple w/ fair ROM; no JVD; normal carotid impulses w/o bruits; no thyromegaly or nodules palpated; no lymphadenopathy.    RESP  Clear  P & A; w/o, wheezes/ rales/ or rhonchi. no accessory muscle use, no dullness to percussion  CARD:  RRR, no m/r/g, no peripheral edema, pulses intact, no cyanosis or clubbing.  GI:   Soft & nt; nml bowel sounds; no organomegaly or masses detected.   Musco: Warm bil, no deformities or joint swelling noted.   Neuro: alert, no focal deficits noted.    Skin: Warm, no lesions or rashes    Lab Results:    BNP No results found for: BNP  ProBNP No results found for: PROBNP  Imaging: No results found.   Assessment & Plan:   Upper airway cough syndrome Appears to be improving on current regimen  Pt education on meds and sedating effects, caution advised.  Med cost remain issue. Pt education given  Patient's medications were reviewed today and patient education was given. Computerized medication calendar was adjusted/completed  If cough persists may want to consider HRCT chest , along with spirometry w/ DLCO .   Plan  Patient Instructions  Follow med calendar closely and bring to each visit .  Go pick up prescriptions when able.  Follow up with Dr. Melvyn Novas  In  6-8 weeks  and As needed   Please contact office for sooner follow up if symptoms do not improve or worsen or seek emergency care        GERD Restart nexium and pepcid -to see if helps possible GERD related cough   Allergic rhinitis Cont on current regimen .      Rexene Edison, NP 11/02/2017

## 2017-11-03 DIAGNOSIS — E1140 Type 2 diabetes mellitus with diabetic neuropathy, unspecified: Secondary | ICD-10-CM | POA: Diagnosis not present

## 2017-11-03 DIAGNOSIS — E119 Type 2 diabetes mellitus without complications: Secondary | ICD-10-CM | POA: Diagnosis not present

## 2017-11-03 DIAGNOSIS — K219 Gastro-esophageal reflux disease without esophagitis: Secondary | ICD-10-CM | POA: Diagnosis not present

## 2017-11-03 DIAGNOSIS — J3089 Other allergic rhinitis: Secondary | ICD-10-CM | POA: Diagnosis not present

## 2017-11-03 DIAGNOSIS — R635 Abnormal weight gain: Secondary | ICD-10-CM | POA: Diagnosis not present

## 2017-11-03 DIAGNOSIS — I10 Essential (primary) hypertension: Secondary | ICD-10-CM | POA: Diagnosis not present

## 2017-11-03 DIAGNOSIS — J45909 Unspecified asthma, uncomplicated: Secondary | ICD-10-CM | POA: Diagnosis not present

## 2017-11-03 DIAGNOSIS — L239 Allergic contact dermatitis, unspecified cause: Secondary | ICD-10-CM | POA: Diagnosis not present

## 2017-12-12 ENCOUNTER — Ambulatory Visit (INDEPENDENT_AMBULATORY_CARE_PROVIDER_SITE_OTHER): Payer: Medicare Other | Admitting: Internal Medicine

## 2017-12-12 ENCOUNTER — Encounter: Payer: Self-pay | Admitting: Internal Medicine

## 2017-12-12 VITALS — BP 132/80 | HR 69 | Ht 65.0 in | Wt 213.2 lb

## 2017-12-12 DIAGNOSIS — R05 Cough: Secondary | ICD-10-CM | POA: Diagnosis not present

## 2017-12-12 DIAGNOSIS — J45991 Cough variant asthma: Secondary | ICD-10-CM | POA: Diagnosis not present

## 2017-12-12 DIAGNOSIS — R058 Other specified cough: Secondary | ICD-10-CM

## 2017-12-12 NOTE — Patient Instructions (Signed)

## 2017-12-12 NOTE — Progress Notes (Signed)
Subjective:     Patient ID: Angela Daugherty, female   DOB: [birth year],    MRN: [id number]    Brief patient profile:  27 yobf never smoker with ? Seasonal rhinitis on allergy shots per Dr Shara Blazing seemed better then ? Intermittent  asthma in her late 20's  maint on singulair/prn saba but much worse symptoms of coughing x summer 2018  referred to pulmonary clinic 12/12/2016 by Dr   Barnie Alderman APP     History of Present Illness  12/12/2016 1st [facility] office visit/ Angela Daugherty   Chief Complaint  Patient presents with  . Pulmonary Consult    Referred by Raelyn Number, PA.  She c/o cough x 3 months- occ prod with clear sputum.  Cough is esp worse at night and sometimes wakes her up.    symptoms day = night = throat clearing and noct min productive cough wakes her no better on inhaler or after adding nexium rec Stop lisinopril  Start losartan 100- 25 mg daily in its place Continue nexium Take 30-60 min before first meal of the day and add pepcid ac 20 mg at bedtime until you stop coughing  Prednisone 10 mg take  4 each am x 2 days,   2 each am x 2 days,  1 each am x 2 days and stop    01/12/17 NP ov - 100% better but  Still using tramadol    12/22/2016  f/u ov/Angela Daugherty re:  Cough off acei since last ov but did not follow the other instructions / rec check bp Chief Complaint  Patient presents with  . Follow-up    patient states that she has not been able to get rid of her cough and sneezing.  changed lisinopril  To losartan 100-25 Ran out of nexium one day prior to OV   Not on pepcid  at all Lots of nose runny nose/ sneezing ? On zyrtec rec Take delsym two tsp every 12 hours and supplement if needed with  tramadol 50 mg up to 1  every 4 hours to suppress the urge to cough  Once you have eliminated the cough for 3 straight days try reducing the tramadol first,  then the delsym as tolerated.   For drainage/sneezing  / throat tickle try take CHLORPHENIRAMINE  4 mg - take one every 4  hours as needed - available over the counter- may cause drowsiness so start with just a bedtime dose or two and see how you tolerate it before trying in daytime   Add pepcid 20 mg (famotidine)  on at bedtime until return     01/30/2017  f/u ov/Angela Daugherty re: cough since summer 2018 only gone while on tramadol  Chief Complaint  Patient presents with  . Follow-up    Increased cough x 2 wks- esp worse at night. She is out of her albuterol inhaler.   cold air makes her cough / not clear saba helped but not taking any more Cough worse at hs then settles down over night p h1 at hs  Not limited by breathing from desired activities   Not clear she understands how/ when to use her meds  rec Please remember to go to the lab department downstairs in the basement  for your tests - we will call you with the results when they are available. See calendar  Prednisone 10 mg take  4 each am x 2 days,   2 each am x 2 days,  1 each  am x 2 days and stop       02/07/2017  f/u ov/Angela Daugherty re:  Uacs/ not using h2 hs and using lots of peppermints / no med calendar  Chief Complaint  Patient presents with  . Follow-up    coughing no better, wakes her up at night.   daytime cough is much improved, now mostly just throat clearing daytime with voice use and noct cough but extinguishes and able to sleep fine s cough rec For drainage / throat tickle try take CHLORPHENIRAMINE  4 mg -  Add pepcid 20 mg one hour before bedtime (we gave you a prescription previously but if you lost it you can get it over the counter or call for another rx )  GERD  diet    03/12/17 NP for med calendar> return with med calendar in hand   08/24/2017 acute extended ov/Angela Daugherty re: cough despite singulair/zyrtec maint rx Chief Complaint  Patient presents with  . Acute Visit    Increased cough x 2 months- occ prod with clear sputum.   living with cats and dogs outside  Cough was some better then worse esp at hs  p stopped nexium/ pepcid/ h1  Dry  day throat clearing and noct coughing fits disrupting sleep. Not limited by breathing from desired activities  But very sedentary   Confused again with meds/ no med cal/ says had MCT at Dr Pricilla Loveless office last week with no new recs other than to return here  rec Start back on nexium 40 mg  Take  30-60 min before first meal of the day and Pepcid (famotidine)  20 mg one @  bedtime until return to office - this is the best way to tell whether stomach acid is contributing to your problem. For drainage / throat tickle try take CHLORPHENIRAMINE  4 mg - take one every 4 hours as needed -  Take delsym two tsp every 12 hours and supplement if needed with  tramadol 50 mg up to 2 every 4 hours to suppress the urge to cough.  Once you have eliminated the cough for 3 straight days try reducing the tramadol first,  then the delsym as tolerated.   Please see patient coordinator before you leave today  to schedule Methacholine challenge test    MCT  09/04/17 > pos at highest dose > caused chest tightness and some cough but did not really reproduce her symptoms.     10/09/2017  f/u ov/Angela Daugherty re: chronic  Cough since summer 2018 neg response to prednisone/ maint on singualir  Chief Complaint  Patient presents with  . Follow-up    Breathing is doing well. She is coughing more- relates to anxiety after her sister's death.   Dyspnea:  Not limited by breathing from desired activities   Cough: sporadic day and noct non prod Sleeping: variably coughs  Taking 3 different forms of antihistamines  rec Stop zyrtec and chlorpheniramine  Increase hydroxyzine to 25 mg four times a day Gabapentin 100 mg four times daily as per med calendar See Tammy NP w/in 2 weeks   11/02/17 NP rec Follow med calendar closely and bring to each visit .  Go pick up prescriptions when able.     12/12/2017  f/u ov/Angela Daugherty re: uacs vs cough variant asthma maint just on gabapentin / singulair , no longer on gerd rx  still confused with h1's  but denies they make her sleepy  Chief Complaint  Patient presents with  . Follow-up  Cough is some better.    Dyspnea:  Not limited by breathing from desired activities ,i Cough: pretty much gone Sleeping: flat bed/ one pillow  SABA use: none/ no inhalers 02: none     No obvious day to day or daytime variability or assoc excess/ purulent sputum or mucus plugs or hemoptysis or cp or chest tightness, subjective wheeze or overt sinus or hb symptoms.   Sleeping as above  without nocturnal  or early am exacerbation  of respiratory  c/o's or need for noct saba. Also denies any obvious fluctuation of symptoms with weather or environmental changes or other aggravating or alleviating factors except as outlined above   No unusual exposure hx or h/o childhood pna/ asthma or knowledge of premature birth.  Current Allergies, Complete Past Medical History, Past Surgical History, Family History, and Social History were reviewed in [employer] record.  ROS  The following are not active complaints unless bolded Hoarseness, sore throat, dysphagia, dental problems, itching, sneezing,  nasal congestion or discharge of excess mucus or purulent secretions, ear ache,   fever, chills, sweats, unintended wt loss or wt gain, classically pleuritic or exertional cp,  orthopnea pnd or arm/hand swelling  or leg swelling, presyncope, palpitations, abdominal pain, anorexia, nausea, vomiting, diarrhea  or change in bowel habits or change in bladder habits, change in stools or change in urine, dysuria, hematuria,  rash, arthralgias, visual complaints, headache, numbness, weakness or ataxia or problems with walking or coordination,  change in mood or  memory.        Current Meds  Medication Sig  . amLODipine (NORVASC) 10 MG tablet Take 10 mg by mouth daily.  . chlorpheniramine (CHLOR-TRIMETON) 4 MG tablet Take 4 mg by mouth every 4 (four) hours as needed for allergies.  Marland Kitchen gabapentin (NEURONTIN)  100 MG capsule Take 1 capsule (100 mg total) by mouth 4 (four) times daily.  . hydrOXYzine (ATARAX/VISTARIL) 25 MG tablet Take 1 tablet (25 mg total) by mouth 4 (four) times daily.  . metFORMIN (GLUCOPHAGE) 500 MG tablet Take 500 mg by mouth 2 (two) times daily.  . montelukast (SINGULAIR) 10 MG tablet Take 1 tablet (10 mg total) by mouth at bedtime.                    Objective:   Physical Exam  amb obese bf nad    12/12/2017        213  10/09/2017          207  08/24/2017        204  02/07/2017        191 01/30/2017      197   12/22/2016     189   12/12/16 195 lb (88.5 kg)  09/02/16 194 lb 9.6 oz (88.3 kg)  11/01/14 217 lb (98.4 kg)      Vital signs reviewed - Note on arrival 02 sats  100% on RA     HEENT: nl dentition, turbinates bilaterally, and oropharynx. Nl external ear canals without cough reflex   NECK :  without JVD/Nodes/TM/ nl carotid upstrokes bilaterally   LUNGS: no acc muscle use,  Nl contour chest which is clear to A and P bilaterally without cough on insp or exp maneuvers   CV:  RRR  no s3 or murmur or increase in P2, and no edema   ABD:  Obese/ soft and nontender with nl inspiratory excursion in the supine position. No bruits or organomegaly appreciated, bowel  sounds nl  MS:  Nl gait/ ext warm without deformities, calf tenderness, cyanosis or clubbing No obvious joint restrictions   SKIN: warm and dry without lesions    NEURO:  alert, approp, nl sensorium with  no motor or cerebellar deficits apparent.         Assessment:

## 2017-12-13 ENCOUNTER — Encounter: Payer: Self-pay | Admitting: Internal Medicine

## 2017-12-13 NOTE — Assessment & Plan Note (Addendum)
Onset summer 2018 Spirometry 09/02/2016  Nl with min curvature  -  Try off acei 12/12/2016  -  No better 12/22/2016 so rec max rx for gerd/ 1st gen H1 blockers per guidelines  And cyclical cough regimen> resolved just while on tramadol - FENO 01/30/2017  =   5  -  Allergy profile 01/30/2017 >  Eos 0.3 /  IgE  195  RAST pos ragweed, grass, trees cats and dogs  - 01/30/2017  Due to hs cough rec h1 and h2 one hour before hs and pred x 6 days but no more tramadol to judge response s suppressing the cough  > return 02/07/2017 did not follow instructions  - 08/24/2017 worse off gerd rx and h1 > resume do  MCT 09/04/17 > pos at highest dose but did not reproduce symptoms  - 10/09/17  trial of gabapentin 100 qid and consolidate H1 to hydroxyzine 25 mg qid > improved 12/12/2017    Adequate control on present rx, reviewed in detail with pt > no change in rx needed  - can use hyrdroxyzine or 1st gen H1 blockers per guidelines  But no need for both > med calendar corrected   I had an extended discussion with the patient reviewing all relevant studies completed to date and  lasting 15 to 20 minutes of a 25 minute visit    Each maintenance medication was reviewed in detail including most importantly the difference between maintenance and prns and under what circumstances the prns are to be triggered using an action plan format that is not reflected in the computer generated alphabetically organized AVS but trather by a customized med calendar that reflects the AVS meds with confirmed 100% correlation.   In addition, Please see AVS for unique instructions that I personally wrote and verbalized to the the pt in detail and then reviewed with pt  by my nurse highlighting any  changes in therapy recommended at today's visit to their plan of care.

## 2017-12-13 NOTE — Assessment & Plan Note (Signed)
MCT  09/04/17 > pos at highest dose but did not reproduce symtpoms   No noct symptoms on just singulair > no change rx/ no need for any inhalers at all

## 2017-12-14 ENCOUNTER — Ambulatory Visit: Payer: Medicare Other | Admitting: Internal Medicine

## 2018-01-08 DIAGNOSIS — K219 Gastro-esophageal reflux disease without esophagitis: Secondary | ICD-10-CM | POA: Diagnosis not present

## 2018-01-08 DIAGNOSIS — I10 Essential (primary) hypertension: Secondary | ICD-10-CM | POA: Diagnosis not present

## 2018-01-08 DIAGNOSIS — J3089 Other allergic rhinitis: Secondary | ICD-10-CM | POA: Diagnosis not present

## 2018-01-08 DIAGNOSIS — R635 Abnormal weight gain: Secondary | ICD-10-CM | POA: Diagnosis not present

## 2018-01-08 DIAGNOSIS — E1140 Type 2 diabetes mellitus with diabetic neuropathy, unspecified: Secondary | ICD-10-CM | POA: Diagnosis not present

## 2018-01-08 DIAGNOSIS — E119 Type 2 diabetes mellitus without complications: Secondary | ICD-10-CM | POA: Diagnosis not present

## 2018-01-08 DIAGNOSIS — L239 Allergic contact dermatitis, unspecified cause: Secondary | ICD-10-CM | POA: Diagnosis not present

## 2018-01-08 DIAGNOSIS — J45909 Unspecified asthma, uncomplicated: Secondary | ICD-10-CM | POA: Diagnosis not present

## 2018-02-12 DIAGNOSIS — K219 Gastro-esophageal reflux disease without esophagitis: Secondary | ICD-10-CM | POA: Diagnosis not present

## 2018-02-12 DIAGNOSIS — R635 Abnormal weight gain: Secondary | ICD-10-CM | POA: Diagnosis not present

## 2018-02-12 DIAGNOSIS — J45909 Unspecified asthma, uncomplicated: Secondary | ICD-10-CM | POA: Diagnosis not present

## 2018-02-12 DIAGNOSIS — L239 Allergic contact dermatitis, unspecified cause: Secondary | ICD-10-CM | POA: Diagnosis not present

## 2018-02-12 DIAGNOSIS — J3089 Other allergic rhinitis: Secondary | ICD-10-CM | POA: Diagnosis not present

## 2018-02-12 DIAGNOSIS — Z23 Encounter for immunization: Secondary | ICD-10-CM | POA: Diagnosis not present

## 2018-02-12 DIAGNOSIS — E119 Type 2 diabetes mellitus without complications: Secondary | ICD-10-CM | POA: Diagnosis not present

## 2018-02-12 DIAGNOSIS — E1140 Type 2 diabetes mellitus with diabetic neuropathy, unspecified: Secondary | ICD-10-CM | POA: Diagnosis not present

## 2018-02-12 DIAGNOSIS — M79661 Pain in right lower leg: Secondary | ICD-10-CM | POA: Diagnosis not present

## 2018-02-12 DIAGNOSIS — I10 Essential (primary) hypertension: Secondary | ICD-10-CM | POA: Diagnosis not present

## 2018-03-15 ENCOUNTER — Ambulatory Visit: Payer: Medicare Other | Admitting: Internal Medicine

## 2018-04-16 ENCOUNTER — Ambulatory Visit: Payer: Medicare Other | Admitting: Internal Medicine

## 2018-04-24 ENCOUNTER — Ambulatory Visit: Payer: Medicare Other | Admitting: Internal Medicine

## 2018-05-29 ENCOUNTER — Ambulatory Visit: Payer: Medicare Other | Admitting: Internal Medicine

## 2018-06-13 DIAGNOSIS — J45909 Unspecified asthma, uncomplicated: Secondary | ICD-10-CM | POA: Diagnosis not present

## 2018-06-13 DIAGNOSIS — K219 Gastro-esophageal reflux disease without esophagitis: Secondary | ICD-10-CM | POA: Diagnosis not present

## 2018-06-13 DIAGNOSIS — L239 Allergic contact dermatitis, unspecified cause: Secondary | ICD-10-CM | POA: Diagnosis not present

## 2018-06-13 DIAGNOSIS — I10 Essential (primary) hypertension: Secondary | ICD-10-CM | POA: Diagnosis not present

## 2018-06-13 DIAGNOSIS — E119 Type 2 diabetes mellitus without complications: Secondary | ICD-10-CM | POA: Diagnosis not present

## 2018-06-13 DIAGNOSIS — J3089 Other allergic rhinitis: Secondary | ICD-10-CM | POA: Diagnosis not present

## 2018-06-13 DIAGNOSIS — E1140 Type 2 diabetes mellitus with diabetic neuropathy, unspecified: Secondary | ICD-10-CM | POA: Diagnosis not present

## 2018-06-13 DIAGNOSIS — R635 Abnormal weight gain: Secondary | ICD-10-CM | POA: Diagnosis not present

## 2018-08-14 DIAGNOSIS — K219 Gastro-esophageal reflux disease without esophagitis: Secondary | ICD-10-CM | POA: Diagnosis not present

## 2018-08-14 DIAGNOSIS — J40 Bronchitis, not specified as acute or chronic: Secondary | ICD-10-CM | POA: Diagnosis not present

## 2018-08-14 DIAGNOSIS — J45909 Unspecified asthma, uncomplicated: Secondary | ICD-10-CM | POA: Diagnosis not present

## 2018-08-14 DIAGNOSIS — R635 Abnormal weight gain: Secondary | ICD-10-CM | POA: Diagnosis not present

## 2018-08-14 DIAGNOSIS — L239 Allergic contact dermatitis, unspecified cause: Secondary | ICD-10-CM | POA: Diagnosis not present

## 2018-08-14 DIAGNOSIS — J3089 Other allergic rhinitis: Secondary | ICD-10-CM | POA: Diagnosis not present

## 2018-08-14 DIAGNOSIS — E119 Type 2 diabetes mellitus without complications: Secondary | ICD-10-CM | POA: Diagnosis not present

## 2018-08-14 DIAGNOSIS — E1140 Type 2 diabetes mellitus with diabetic neuropathy, unspecified: Secondary | ICD-10-CM | POA: Diagnosis not present

## 2018-08-14 DIAGNOSIS — I10 Essential (primary) hypertension: Secondary | ICD-10-CM | POA: Diagnosis not present

## 2018-10-05 DIAGNOSIS — E119 Type 2 diabetes mellitus without complications: Secondary | ICD-10-CM | POA: Diagnosis not present

## 2018-10-05 DIAGNOSIS — Z1389 Encounter for screening for other disorder: Secondary | ICD-10-CM | POA: Diagnosis not present

## 2018-10-05 DIAGNOSIS — I10 Essential (primary) hypertension: Secondary | ICD-10-CM | POA: Diagnosis not present

## 2018-10-05 DIAGNOSIS — E1140 Type 2 diabetes mellitus with diabetic neuropathy, unspecified: Secondary | ICD-10-CM | POA: Diagnosis not present

## 2018-10-05 DIAGNOSIS — J45909 Unspecified asthma, uncomplicated: Secondary | ICD-10-CM | POA: Diagnosis not present

## 2018-10-05 DIAGNOSIS — R635 Abnormal weight gain: Secondary | ICD-10-CM | POA: Diagnosis not present

## 2018-10-05 DIAGNOSIS — J3089 Other allergic rhinitis: Secondary | ICD-10-CM | POA: Diagnosis not present

## 2018-10-05 DIAGNOSIS — K219 Gastro-esophageal reflux disease without esophagitis: Secondary | ICD-10-CM | POA: Diagnosis not present

## 2018-10-05 DIAGNOSIS — L239 Allergic contact dermatitis, unspecified cause: Secondary | ICD-10-CM | POA: Diagnosis not present

## 2018-12-21 DIAGNOSIS — J45909 Unspecified asthma, uncomplicated: Secondary | ICD-10-CM | POA: Diagnosis not present

## 2018-12-21 DIAGNOSIS — R635 Abnormal weight gain: Secondary | ICD-10-CM | POA: Diagnosis not present

## 2018-12-21 DIAGNOSIS — J3089 Other allergic rhinitis: Secondary | ICD-10-CM | POA: Diagnosis not present

## 2018-12-21 DIAGNOSIS — L239 Allergic contact dermatitis, unspecified cause: Secondary | ICD-10-CM | POA: Diagnosis not present

## 2018-12-21 DIAGNOSIS — J0100 Acute maxillary sinusitis, unspecified: Secondary | ICD-10-CM | POA: Diagnosis not present

## 2018-12-21 DIAGNOSIS — E119 Type 2 diabetes mellitus without complications: Secondary | ICD-10-CM | POA: Diagnosis not present

## 2018-12-21 DIAGNOSIS — K219 Gastro-esophageal reflux disease without esophagitis: Secondary | ICD-10-CM | POA: Diagnosis not present

## 2018-12-21 DIAGNOSIS — I10 Essential (primary) hypertension: Secondary | ICD-10-CM | POA: Diagnosis not present

## 2018-12-21 DIAGNOSIS — E1140 Type 2 diabetes mellitus with diabetic neuropathy, unspecified: Secondary | ICD-10-CM | POA: Diagnosis not present

## 2018-12-26 ENCOUNTER — Other Ambulatory Visit: Payer: Self-pay

## 2018-12-26 NOTE — Patient Outreach (Signed)
[facility]) Care Management  12/26/2018  Angela Daugherty 1970-03-21 [id number]   Medication Adherence call to Mrs. Angela Daugherty patient telephone number is disconnected patient is past due on Metformin 500 mg under [place].   [facility] Direct Dial [phone]  Fax [phone] [email or web address]

## 2019-02-12 DIAGNOSIS — L239 Allergic contact dermatitis, unspecified cause: Secondary | ICD-10-CM | POA: Diagnosis not present

## 2019-02-12 DIAGNOSIS — Z23 Encounter for immunization: Secondary | ICD-10-CM | POA: Diagnosis not present

## 2019-02-12 DIAGNOSIS — J3089 Other allergic rhinitis: Secondary | ICD-10-CM | POA: Diagnosis not present

## 2019-02-12 DIAGNOSIS — J45909 Unspecified asthma, uncomplicated: Secondary | ICD-10-CM | POA: Diagnosis not present

## 2019-02-12 DIAGNOSIS — K219 Gastro-esophageal reflux disease without esophagitis: Secondary | ICD-10-CM | POA: Diagnosis not present

## 2019-02-12 DIAGNOSIS — E119 Type 2 diabetes mellitus without complications: Secondary | ICD-10-CM | POA: Diagnosis not present

## 2019-05-14 DIAGNOSIS — Z01118 Encounter for examination of ears and hearing with other abnormal findings: Secondary | ICD-10-CM | POA: Diagnosis not present

## 2019-05-14 DIAGNOSIS — E1165 Type 2 diabetes mellitus with hyperglycemia: Secondary | ICD-10-CM | POA: Diagnosis not present

## 2019-05-14 DIAGNOSIS — Z01021 Encounter for examination of eyes and vision following failed vision screening with abnormal findings: Secondary | ICD-10-CM | POA: Diagnosis not present

## 2019-05-14 DIAGNOSIS — Z5181 Encounter for therapeutic drug level monitoring: Secondary | ICD-10-CM | POA: Diagnosis not present

## 2019-05-14 DIAGNOSIS — J4520 Mild intermittent asthma, uncomplicated: Secondary | ICD-10-CM | POA: Diagnosis not present

## 2019-05-14 DIAGNOSIS — Z1329 Encounter for screening for other suspected endocrine disorder: Secondary | ICD-10-CM | POA: Diagnosis not present

## 2019-05-14 DIAGNOSIS — I10 Essential (primary) hypertension: Secondary | ICD-10-CM | POA: Diagnosis not present

## 2019-05-14 DIAGNOSIS — Z0001 Encounter for general adult medical examination with abnormal findings: Secondary | ICD-10-CM | POA: Diagnosis not present

## 2019-05-14 DIAGNOSIS — Z136 Encounter for screening for cardiovascular disorders: Secondary | ICD-10-CM | POA: Diagnosis not present

## 2019-05-28 DIAGNOSIS — R05 Cough: Secondary | ICD-10-CM | POA: Diagnosis not present

## 2019-05-28 DIAGNOSIS — E1165 Type 2 diabetes mellitus with hyperglycemia: Secondary | ICD-10-CM | POA: Diagnosis not present

## 2019-05-28 DIAGNOSIS — I10 Essential (primary) hypertension: Secondary | ICD-10-CM | POA: Diagnosis not present

## 2019-05-28 DIAGNOSIS — J4520 Mild intermittent asthma, uncomplicated: Secondary | ICD-10-CM | POA: Diagnosis not present

## 2019-06-04 DIAGNOSIS — J4520 Mild intermittent asthma, uncomplicated: Secondary | ICD-10-CM | POA: Diagnosis not present

## 2019-06-04 DIAGNOSIS — I10 Essential (primary) hypertension: Secondary | ICD-10-CM | POA: Diagnosis not present

## 2019-06-04 DIAGNOSIS — M79641 Pain in right hand: Secondary | ICD-10-CM | POA: Diagnosis not present

## 2019-06-04 DIAGNOSIS — M25561 Pain in right knee: Secondary | ICD-10-CM | POA: Diagnosis not present

## 2019-06-04 DIAGNOSIS — E1165 Type 2 diabetes mellitus with hyperglycemia: Secondary | ICD-10-CM | POA: Diagnosis not present

## 2019-06-06 DIAGNOSIS — M1711 Unilateral primary osteoarthritis, right knee: Secondary | ICD-10-CM | POA: Diagnosis not present

## 2019-08-13 DIAGNOSIS — M1711 Unilateral primary osteoarthritis, right knee: Secondary | ICD-10-CM | POA: Diagnosis not present

## 2019-08-23 DIAGNOSIS — M25561 Pain in right knee: Secondary | ICD-10-CM | POA: Diagnosis not present

## 2019-10-09 DIAGNOSIS — E119 Type 2 diabetes mellitus without complications: Secondary | ICD-10-CM | POA: Diagnosis not present

## 2019-10-09 DIAGNOSIS — J3089 Other allergic rhinitis: Secondary | ICD-10-CM | POA: Diagnosis not present

## 2019-10-09 DIAGNOSIS — Z0001 Encounter for general adult medical examination with abnormal findings: Secondary | ICD-10-CM | POA: Diagnosis not present

## 2019-10-18 DIAGNOSIS — M25561 Pain in right knee: Secondary | ICD-10-CM | POA: Diagnosis not present

## 2019-11-07 ENCOUNTER — Encounter (HOSPITAL_BASED_OUTPATIENT_CLINIC_OR_DEPARTMENT_OTHER): Payer: Self-pay | Admitting: Orthopaedic Surgery

## 2019-11-07 ENCOUNTER — Other Ambulatory Visit: Payer: Self-pay

## 2019-11-12 ENCOUNTER — Encounter (HOSPITAL_BASED_OUTPATIENT_CLINIC_OR_DEPARTMENT_OTHER)
Admission: RE | Admit: 2019-11-12 | Discharge: 2019-11-12 | Disposition: A | Discharge status: Home / Self Care | Payer: Medicare Other | Source: Ambulatory Visit | Attending: Orthopaedic Surgery | Admitting: Orthopaedic Surgery

## 2019-11-12 ENCOUNTER — Other Ambulatory Visit (HOSPITAL_COMMUNITY)
Admission: RE | Admit: 2019-11-12 | Discharge: 2019-11-12 | Disposition: A | Discharge status: Home / Self Care | Payer: Medicare Other | Source: Ambulatory Visit | Attending: Orthopaedic Surgery | Admitting: Orthopaedic Surgery

## 2019-11-12 DIAGNOSIS — Z01812 Encounter for preprocedural laboratory examination: Secondary | ICD-10-CM | POA: Diagnosis not present

## 2019-11-12 DIAGNOSIS — M1990 Unspecified osteoarthritis, unspecified site: Secondary | ICD-10-CM | POA: Diagnosis not present

## 2019-11-12 DIAGNOSIS — M2241 Chondromalacia patellae, right knee: Secondary | ICD-10-CM | POA: Diagnosis not present

## 2019-11-12 DIAGNOSIS — Z01810 Encounter for preprocedural cardiovascular examination: Secondary | ICD-10-CM | POA: Diagnosis not present

## 2019-11-12 DIAGNOSIS — X58XXXA Exposure to other specified factors, initial encounter: Secondary | ICD-10-CM | POA: Diagnosis not present

## 2019-11-12 DIAGNOSIS — Z20822 Contact with and (suspected) exposure to covid-19: Secondary | ICD-10-CM | POA: Insufficient documentation

## 2019-11-12 DIAGNOSIS — Z6841 Body Mass Index (BMI) 40.0 and over, adult: Secondary | ICD-10-CM | POA: Diagnosis not present

## 2019-11-12 DIAGNOSIS — S83241A Other tear of medial meniscus, current injury, right knee, initial encounter: Secondary | ICD-10-CM | POA: Diagnosis not present

## 2019-11-12 DIAGNOSIS — I10 Essential (primary) hypertension: Secondary | ICD-10-CM | POA: Diagnosis not present

## 2019-11-12 DIAGNOSIS — Z7984 Long term (current) use of oral hypoglycemic drugs: Secondary | ICD-10-CM | POA: Diagnosis not present

## 2019-11-12 DIAGNOSIS — Z79899 Other long term (current) drug therapy: Secondary | ICD-10-CM | POA: Diagnosis not present

## 2019-11-12 DIAGNOSIS — E119 Type 2 diabetes mellitus without complications: Secondary | ICD-10-CM | POA: Diagnosis not present

## 2019-11-12 LAB — BASIC METABOLIC PANEL
Anion gap: 9 (ref 5–15)
BUN: 10 mg/dL (ref 6–20)
CO2: 26 mmol/L (ref 22–32)
Calcium: 9.4 mg/dL (ref 8.9–10.3)
Chloride: 107 mmol/L (ref 98–111)
Creatinine, Ser: 0.68 mg/dL (ref 0.44–1.00)
GFR calc Af Amer: >60 mL/min (ref >60)
GFR calc non Af Amer: >60 mL/min (ref >60)
Glucose, Bld: 251 mg/dL — ABNORMAL HIGH (ref 70–99)
Potassium: 3.8 mmol/L (ref 3.5–5.1)
Sodium: 142 mmol/L (ref 135–145)

## 2019-11-12 LAB — SARS CORONAVIRUS 2 (TAT 6-24 HRS): SARS Coronavirus 2: NEGATIVE

## 2019-11-12 NOTE — Progress Notes (Signed)

## 2019-11-13 NOTE — Anesthesia Preprocedure Evaluation (Addendum)
Anesthesia Evaluation  Patient identified by MRN, date of birth, ID band Patient awake    Reviewed: Allergy & Precautions, NPO status , Patient's Chart, lab work & pertinent test results  History of Anesthesia Complications Negative for: history of anesthetic complications  Airway Mallampati: III  TM Distance: >3 FB Neck ROM: Full    Dental no notable dental hx. (+) Dental Advisory Given   Pulmonary asthma ,    Pulmonary exam normal        Cardiovascular hypertension, Pt. on medications Normal cardiovascular exam     Neuro/Psych negative neurological ROS     GI/Hepatic Neg liver ROS, GERD  ,  Endo/Other  diabetesMorbid obesity  Renal/GU negative Renal ROS     Musculoskeletal negative musculoskeletal ROS (+)   Abdominal   Peds  Hematology negative hematology ROS (+)   Anesthesia Other Findings   Reproductive/Obstetrics                            Anesthesia Physical Anesthesia Plan  ASA: III  Anesthesia Plan: General   Post-op Pain Management:    Induction: Intravenous  PONV Risk Score and Plan: 4 or greater and Ondansetron, Dexamethasone and Midazolam  Airway Management Planned: LMA  Additional Equipment:   Intra-op Plan:   Post-operative Plan: Extubation in OR  Informed Consent: I have reviewed the patients History and Physical, chart, labs and discussed the procedure including the risks, benefits and alternatives for the proposed anesthesia with the patient or authorized representative who has indicated his/her understanding and acceptance.     Dental advisory given  Plan Discussed with: Anesthesiologist and CRNA  Anesthesia Plan Comments:        Anesthesia Quick Evaluation

## 2019-11-14 ENCOUNTER — Other Ambulatory Visit: Payer: Self-pay

## 2019-11-14 ENCOUNTER — Encounter (HOSPITAL_BASED_OUTPATIENT_CLINIC_OR_DEPARTMENT_OTHER): Payer: Self-pay | Admitting: Orthopaedic Surgery

## 2019-11-14 ENCOUNTER — Encounter (HOSPITAL_BASED_OUTPATIENT_CLINIC_OR_DEPARTMENT_OTHER)
Admission: RE | Disposition: A | Discharge status: Home / Self Care | Payer: Self-pay | Source: Home / Self Care | Attending: Orthopaedic Surgery

## 2019-11-14 ENCOUNTER — Ambulatory Visit (HOSPITAL_BASED_OUTPATIENT_CLINIC_OR_DEPARTMENT_OTHER): Payer: Medicare Other | Admitting: Anesthesiology

## 2019-11-14 ENCOUNTER — Ambulatory Visit (HOSPITAL_BASED_OUTPATIENT_CLINIC_OR_DEPARTMENT_OTHER)
Admission: RE | Admit: 2019-11-14 | Discharge: 2019-11-14 | Disposition: A | Discharge status: Home / Self Care | Payer: Medicare Other | Attending: Orthopaedic Surgery | Admitting: Orthopaedic Surgery

## 2019-11-14 DIAGNOSIS — Z7984 Long term (current) use of oral hypoglycemic drugs: Secondary | ICD-10-CM | POA: Insufficient documentation

## 2019-11-14 DIAGNOSIS — E119 Type 2 diabetes mellitus without complications: Secondary | ICD-10-CM | POA: Diagnosis not present

## 2019-11-14 DIAGNOSIS — I10 Essential (primary) hypertension: Secondary | ICD-10-CM | POA: Diagnosis not present

## 2019-11-14 DIAGNOSIS — Z79899 Other long term (current) drug therapy: Secondary | ICD-10-CM | POA: Diagnosis not present

## 2019-11-14 DIAGNOSIS — S83241A Other tear of medial meniscus, current injury, right knee, initial encounter: Secondary | ICD-10-CM | POA: Insufficient documentation

## 2019-11-14 DIAGNOSIS — J309 Allergic rhinitis, unspecified: Secondary | ICD-10-CM | POA: Diagnosis not present

## 2019-11-14 DIAGNOSIS — Z01812 Encounter for preprocedural laboratory examination: Secondary | ICD-10-CM | POA: Diagnosis not present

## 2019-11-14 DIAGNOSIS — X58XXXA Exposure to other specified factors, initial encounter: Secondary | ICD-10-CM | POA: Insufficient documentation

## 2019-11-14 DIAGNOSIS — M1990 Unspecified osteoarthritis, unspecified site: Secondary | ICD-10-CM | POA: Insufficient documentation

## 2019-11-14 DIAGNOSIS — M2241 Chondromalacia patellae, right knee: Secondary | ICD-10-CM | POA: Diagnosis not present

## 2019-11-14 DIAGNOSIS — Z6841 Body Mass Index (BMI) 40.0 and over, adult: Secondary | ICD-10-CM | POA: Insufficient documentation

## 2019-11-14 DIAGNOSIS — Z01810 Encounter for preprocedural cardiovascular examination: Secondary | ICD-10-CM | POA: Diagnosis not present

## 2019-11-14 HISTORY — DX: Type 2 diabetes mellitus without complications: E11.9

## 2019-11-14 HISTORY — PX: KNEE ARTHROSCOPY WITH MEDIAL MENISECTOMY: SHX5651

## 2019-11-14 LAB — GLUCOSE, CAPILLARY
Glucose-Capillary: 189 mg/dL — ABNORMAL HIGH (ref 70–99)
Glucose-Capillary: 159 mg/dL — ABNORMAL HIGH (ref 70–99)

## 2019-11-14 SURGERY — ARTHROSCOPY, KNEE, WITH MEDIAL MENISCECTOMY
Anesthesia: General | Site: Knee | Laterality: Right

## 2019-11-14 MED ORDER — ONDANSETRON HCL 4 MG PO TABS: 4.0000 mg | ORAL_TABLET | Freq: Three times a day (TID) | ORAL | 1 refills | Status: AC | PRN

## 2019-11-14 MED ORDER — BUPIVACAINE HCL (PF) 0.25 % IJ SOLN
INTRAMUSCULAR | Status: AC
  Filled 2019-11-14: qty 30

## 2019-11-14 MED ORDER — ASPIRIN 81 MG PO CHEW: 81.0000 mg | CHEWABLE_TABLET | Freq: Two times a day (BID) | ORAL | 0 refills | Status: AC

## 2019-11-14 MED ORDER — LIDOCAINE 2% (20 MG/ML) 5 ML SYRINGE
INTRAMUSCULAR | Status: AC
  Filled 2019-11-14: qty 5

## 2019-11-14 MED ORDER — DEXAMETHASONE SODIUM PHOSPHATE 10 MG/ML IJ SOLN
INTRAMUSCULAR | Status: AC
  Filled 2019-11-14: qty 1

## 2019-11-14 MED ORDER — ACETAMINOPHEN 500 MG PO TABS
ORAL_TABLET | ORAL | Status: AC
  Filled 2019-11-14: qty 2

## 2019-11-14 MED ORDER — SODIUM CHLORIDE 0.9 % IR SOLN
Status: DC | PRN
  Administered 2019-11-14: 500 mL

## 2019-11-14 MED ORDER — VANCOMYCIN HCL 500 MG IV SOLR
INTRAVENOUS | Status: AC
  Filled 2019-11-14: qty 500

## 2019-11-14 MED ORDER — CELECOXIB 200 MG PO CAPS
ORAL_CAPSULE | ORAL | Status: AC
  Filled 2019-11-14: qty 1

## 2019-11-14 MED ORDER — ACETAMINOPHEN 500 MG PO TABS
1000.0000 mg | ORAL_TABLET | Freq: Once | ORAL | Status: AC
  Administered 2019-11-14: 1000 mg via ORAL

## 2019-11-14 MED ORDER — LIDOCAINE 2% (20 MG/ML) 5 ML SYRINGE
INTRAMUSCULAR | Status: DC | PRN
  Administered 2019-11-14: 100 mg via INTRAVENOUS

## 2019-11-14 MED ORDER — DEXAMETHASONE SODIUM PHOSPHATE 10 MG/ML IJ SOLN
INTRAMUSCULAR | Status: DC | PRN
  Administered 2019-11-14: 4 mg via INTRAVENOUS

## 2019-11-14 MED ORDER — ONDANSETRON HCL 4 MG/2ML IJ SOLN
INTRAMUSCULAR | Status: AC
  Filled 2019-11-14: qty 2

## 2019-11-14 MED ORDER — OXYCODONE HCL 5 MG PO TABS: ORAL_TABLET | ORAL | 0 refills | Status: AC

## 2019-11-14 MED ORDER — FENTANYL CITRATE (PF) 100 MCG/2ML IJ SOLN
INTRAMUSCULAR | Status: AC
  Filled 2019-11-14: qty 2

## 2019-11-14 MED ORDER — KETOROLAC TROMETHAMINE 30 MG/ML IJ SOLN
INTRAMUSCULAR | Status: DC | PRN
  Administered 2019-11-14: 30 mg via INTRAVENOUS

## 2019-11-14 MED ORDER — ONDANSETRON HCL 4 MG/2ML IJ SOLN
INTRAMUSCULAR | Status: DC | PRN
  Administered 2019-11-14: 4 mg via INTRAVENOUS

## 2019-11-14 MED ORDER — CEFAZOLIN SODIUM-DEXTROSE 2-4 GM/100ML-% IV SOLN
INTRAVENOUS | Status: AC
  Filled 2019-11-14: qty 100

## 2019-11-14 MED ORDER — MIDAZOLAM HCL 2 MG/2ML IJ SOLN
INTRAMUSCULAR | Status: AC
  Filled 2019-11-14: qty 2

## 2019-11-14 MED ORDER — CELECOXIB 200 MG PO CAPS
200.0000 mg | ORAL_CAPSULE | Freq: Once | ORAL | Status: AC
  Administered 2019-11-14: 200 mg via ORAL

## 2019-11-14 MED ORDER — EPINEPHRINE PF 1 MG/ML IJ SOLN
INTRAMUSCULAR | Status: AC
  Filled 2019-11-14: qty 1

## 2019-11-14 MED ORDER — BUPIVACAINE HCL (PF) 0.25 % IJ SOLN
INTRAMUSCULAR | Status: DC | PRN
  Administered 2019-11-14: 20 mL

## 2019-11-14 MED ORDER — ACETAMINOPHEN 500 MG PO TABS: 1000.0000 mg | ORAL_TABLET | Freq: Three times a day (TID) | ORAL | 0 refills | Status: AC

## 2019-11-14 MED ORDER — PROPOFOL 10 MG/ML IV BOLUS
INTRAVENOUS | Status: AC
  Filled 2019-11-14: qty 20

## 2019-11-14 MED ORDER — FENTANYL CITRATE (PF) 100 MCG/2ML IJ SOLN
INTRAMUSCULAR | Status: DC | PRN
Start: 2019-11-14 — End: 2019-11-14
  Administered 2019-11-14 (×2): 50 ug via INTRAVENOUS

## 2019-11-14 MED ORDER — MIDAZOLAM HCL 5 MG/5ML IJ SOLN
INTRAMUSCULAR | Status: DC | PRN
  Administered 2019-11-14: 2 mg via INTRAVENOUS

## 2019-11-14 MED ORDER — PROPOFOL 10 MG/ML IV BOLUS
INTRAVENOUS | Status: DC | PRN
  Administered 2019-11-14 (×2): 50 mg via INTRAVENOUS
  Administered 2019-11-14: 200 mg via INTRAVENOUS

## 2019-11-14 MED ORDER — CEFAZOLIN SODIUM-DEXTROSE 2-4 GM/100ML-% IV SOLN
2.0000 g | INTRAVENOUS | Status: AC
  Administered 2019-11-14: 2 g via INTRAVENOUS

## 2019-11-14 MED ORDER — CELECOXIB 100 MG PO CAPS: 100.0000 mg | ORAL_CAPSULE | Freq: Two times a day (BID) | ORAL | 0 refills | Status: AC

## 2019-11-14 MED ORDER — KETOROLAC TROMETHAMINE 30 MG/ML IJ SOLN
INTRAMUSCULAR | Status: AC
  Filled 2019-11-14: qty 1

## 2019-11-14 MED ORDER — LACTATED RINGERS IV SOLN: INTRAVENOUS | Status: DC

## 2019-11-14 MED ORDER — SUCCINYLCHOLINE CHLORIDE 200 MG/10ML IV SOSY
PREFILLED_SYRINGE | INTRAVENOUS | Status: DC | PRN
  Administered 2019-11-14: 100 mg via INTRAVENOUS

## 2019-11-14 SURGICAL SUPPLY — 42 items
APL PRP STRL LF DISP 70% ISPRP (MISCELLANEOUS) ×1
BANDAGE ESMARK 6X9 LF (GAUZE/BANDAGES/DRESSINGS) IMPLANT
BLADE CLIPPER SURG (BLADE) IMPLANT
BNDG CMPR 9X6 STRL LF SNTH (GAUZE/BANDAGES/DRESSINGS)
BNDG ELASTIC 6X5.8 VLCR STR LF (GAUZE/BANDAGES/DRESSINGS) ×3 IMPLANT
BNDG ESMARK 6X9 LF (GAUZE/BANDAGES/DRESSINGS)
CHLORAPREP W/TINT 26 (MISCELLANEOUS) ×3 IMPLANT
CLOSURE STERI-STRIP 1/2X4 (GAUZE/BANDAGES/DRESSINGS) ×1
CLSR STERI-STRIP ANTIMIC 1/2X4 (GAUZE/BANDAGES/DRESSINGS) ×2 IMPLANT
CUFF TOURN SGL QUICK 34 (TOURNIQUET CUFF)
CUFF TRNQT CYL 34X4.125X (TOURNIQUET CUFF) IMPLANT
DISSECTOR 3.5MM X 13CM CVD (MISCELLANEOUS) IMPLANT
DISSECTOR 4.0MMX13CM CVD (MISCELLANEOUS) ×3 IMPLANT
DRAPE ARTHROSCOPY W/POUCH 90 (DRAPES) ×3 IMPLANT
DRAPE IMP U-DRAPE 54X76 (DRAPES) ×3 IMPLANT
DRAPE U-SHAPE 47X51 STRL (DRAPES) ×3 IMPLANT
GAUZE SPONGE 4X4 12PLY STRL (GAUZE/BANDAGES/DRESSINGS) ×3 IMPLANT
GLOVE BIO SURGEON STRL SZ 6.5 (GLOVE) ×2 IMPLANT
GLOVE BIO SURGEONS STRL SZ 6.5 (GLOVE) ×1
GLOVE BIOGEL PI IND STRL 6.5 (GLOVE) ×1 IMPLANT
GLOVE BIOGEL PI IND STRL 8 (GLOVE) ×1 IMPLANT
GLOVE BIOGEL PI INDICATOR 6.5 (GLOVE) ×2
GLOVE BIOGEL PI INDICATOR 8 (GLOVE) ×2
GLOVE ECLIPSE 8.0 STRL XLNG CF (GLOVE) ×6 IMPLANT
GOWN STRL REUS W/ TWL LRG LVL3 (GOWN DISPOSABLE) ×2 IMPLANT
GOWN STRL REUS W/TWL LRG LVL3 (GOWN DISPOSABLE) ×6
GOWN STRL REUS W/TWL XL LVL3 (GOWN DISPOSABLE) ×3 IMPLANT
KIT TURNOVER KIT B (KITS) ×3 IMPLANT
MANIFOLD NEPTUNE II (INSTRUMENTS) ×3 IMPLANT
NDL SAFETY ECLIPSE 18X1.5 (NEEDLE) ×1 IMPLANT
NEEDLE HYPO 18GX1.5 SHARP (NEEDLE) ×3
NS IRRIG 1000ML POUR BTL (IV SOLUTION) IMPLANT
PACK ARTHROSCOPY DSU (CUSTOM PROCEDURE TRAY) ×3 IMPLANT
PORT APPOLLO RF 90DEGREE MULTI (SURGICAL WAND) IMPLANT
SLEEVE SCD COMPRESS KNEE MED (MISCELLANEOUS) ×3 IMPLANT
SUT MNCRL AB 4-0 PS2 18 (SUTURE) ×3 IMPLANT
SYR 5ML LUER SLIP (SYRINGE) ×3 IMPLANT
TOWEL GREEN STERILE FF (TOWEL DISPOSABLE) ×3 IMPLANT
TUBE CONNECTING 20'X1/4 (TUBING) ×1
TUBE CONNECTING 20X1/4 (TUBING) ×2 IMPLANT
TUBING ARTHROSCOPY IRRIG 16FT (MISCELLANEOUS) ×3 IMPLANT
WATER STERILE IRR 1000ML POUR (IV SOLUTION) ×3 IMPLANT

## 2019-11-14 NOTE — Transfer of Care (Signed)
Immediate Anesthesia Transfer of Care Note  Patient: Angela Daugherty  Procedure(s) Performed: KNEE ARTHROSCOPY WITH PARTIAL MEDIAL MENISECTOMY AND CHONDROPLASTY (Right Knee)  Patient Location: PACU  Anesthesia Type:General  Level of Consciousness: awake, alert  and oriented  Airway & Oxygen Therapy: Patient Spontanous Breathing and Patient connected to face mask oxygen  Post-op Assessment: Report given to RN and Post -op Vital signs reviewed and stable  Post vital signs: Reviewed and stable  Last Vitals:  Vitals Value Taken Time  BP 121/83 11/14/19 0958  Temp    Pulse 69 11/14/19 0959  Resp 11 11/14/19 0959  SpO2 99 % 11/14/19 0959  Vitals shown include unvalidated device data.  Last Pain:  Vitals:   11/14/19 0749  TempSrc: Oral  PainSc: 0-No pain      Patients Stated Pain Goal: 4 (27/87/18 3672)  Complications: No complications documented.

## 2019-11-14 NOTE — H&P (Signed)
PREOPERATIVE H&P  Chief Complaint: RIGHT KNEE CHONDROMALACIA PATELLA, MEDIAL MENISCUS TEAR, M22.41 S83.249A  HPI: Angela Daugherty is a 49 y.o. female who is scheduled for KNEE ARTHROSCOPY WITH MEDIAL MENISECTOMY AND CHONDROPLASTY.   Patient has a past medical history significant for diabetes, hypertension, GERD< asthma.   She is a 49 year old whom we have been following for some time for right knee pain. The right knee injection only lasted a couple of hours and the pain began to come back. She has giving out  of her knee at this point.   Her symptoms are rated as moderate to severe, and have been worsening.  This is significantly impairing activities of daily living.    Please see clinic note for further details on this patient's care.    She has elected for surgical management.   Past Medical History:  Diagnosis Date  . Asthma   . Diabetes mellitus without complication ([place])   . Hypertension    History reviewed. No pertinent surgical history. Social History   Socioeconomic History  . Marital status: Single    Spouse name: Not on file  . Number of children: Not on file  . Years of education: Not on file  . Highest education level: Not on file  Occupational History  . Not on file  Tobacco Use  . Smoking status: Never Smoker  . Smokeless tobacco: Never Used  Vaping Use  . Vaping Use: Never used  Substance and Sexual Activity  . Alcohol use: No  . Drug use: No  . Sexual activity: Never  Other Topics Concern  . Not on file  Social History Narrative  . Not on file   Social Determinants of Health   Financial Resource Strain:   . Difficulty of Paying Living Expenses: Not on file  Food Insecurity:   . Worried About Charity fundraiser in the Last Year: Not on file  . Ran Out of Food in the Last Year: Not on file  Transportation Needs:   . Lack of Transportation (Medical): Not on file  . Lack of Transportation (Non-Medical): Not on file  Physical Activity:     . Days of Exercise per Week: Not on file  . Minutes of Exercise per Session: Not on file  Stress:   . Feeling of Stress : Not on file  Social Connections:   . Frequency of Communication with Friends and Family: Not on file  . Frequency of Social Gatherings with Friends and Family: Not on file  . Attends Religious Services: Not on file  . Active Member of Clubs or Organizations: Not on file  . Attends Archivist Meetings: Not on file  . Marital Status: Not on file   Family History  Problem Relation Age of Onset  . Asthma Mother   . Allergic rhinitis Mother   . Asthma Sister   . Allergic rhinitis Maternal Grandmother   . Asthma Maternal Grandmother    No Known Allergies Prior to Admission medications   Medication Sig Start Date End Date Taking? Authorizing Provider  amLODipine (NORVASC) 10 MG tablet Take 10 mg by mouth daily. 11/29/17  Yes [provider]  gabapentin (NEURONTIN) 100 MG capsule Take 1 capsule (100 mg total) by mouth 4 (four) times daily. 10/09/17  Yes Tanda Rockers, MD  metFORMIN (GLUCOPHAGE) 500 MG tablet Take 500 mg by mouth 2 (two) times daily. 10/03/14  Yes [provider]    ROS: All other systems have  been reviewed and were otherwise negative with the exception of those mentioned in the HPI and as above.  Physical Exam: General: Alert, no acute distress Cardiovascular: No pedal edema Respiratory: No cyanosis, no use of accessory musculature GI: No organomegaly, abdomen is soft and non-tender Skin: No lesions in the area of chief complaint Neurologic: Sensation intact distally Psychiatric: Patient is competent for consent with normal mood and affect Lymphatic: No axillary or cervical lymphadenopathy  MUSCULOSKELETAL:  Right knee: Tender to palpation about the medial joint line.  She has a moderate effusion.    Imaging: MRI reviewed and demonstrates a significant amount of medial arthritis.  Radial tear to the medial meniscus.     Assessment: RIGHT KNEE CHONDROMALACIA PATELLA, MEDIAL MENISCUS TEAR, M22.41 S83.249A  Plan: Plan for Procedure(s): KNEE ARTHROSCOPY WITH MEDIAL MENISECTOMY AND CHONDROPLASTY  The risks benefits and alternatives were discussed with the patient including but not limited to the risks of nonoperative treatment, versus surgical intervention including infection, bleeding, nerve injury,  blood clots, cardiopulmonary complications, morbidity, mortality, among others, and they were willing to proceed.   The patient acknowledged the explanation, agreed to proceed with the plan and consent was signed.   Operative Plan: Right knee arthroscopy with medial meniscectomy with a chondroplasty Discharge Medications: Tylenol, Celebrex, Oxycodone, Zofran DVT Prophylaxis: Aspirin Physical Therapy: +/- outpatient PT Special Discharge needs: Knee immobilizer if received a block   Ethelda Chick, PA-C  11/14/2019 6:12 AM

## 2019-11-14 NOTE — Anesthesia Postprocedure Evaluation (Signed)
Anesthesia Post Note  Patient: Angela Daugherty  Procedure(s) Performed: RIGHT KNEE ARTHROSCOPY WITH PARTIAL MEDIAL MENISECTOMY AND CHONDROPLASTY (Right Knee)     Patient location during evaluation: PACU Anesthesia Type: General Level of consciousness: awake and alert and oriented Pain management: pain level controlled Vital Signs Assessment: post-procedure vital signs reviewed and stable Respiratory status: spontaneous breathing, nonlabored ventilation and respiratory function stable Cardiovascular status: blood pressure returned to baseline Postop Assessment: no apparent nausea or vomiting Anesthetic complications: no   No complications documented.  Last Vitals:  Vitals:   11/14/19 1030 11/14/19 1050  BP: 124/88 136/77  Pulse: 66 63  Resp: 17 16  Temp:  36.7 C  SpO2: 99% 97%    Last Pain:  Vitals:   11/14/19 1050  TempSrc: Oral  PainSc: 0-No pain                 Brennan Bailey

## 2019-11-14 NOTE — Anesthesia Procedure Notes (Signed)
Procedure Name: Intubation Date/Time: 11/14/2019 9:19 AM Performed by: Genelle Bal, CRNA Pre-anesthesia Checklist: Patient identified, Emergency Drugs available, Suction available and Patient being monitored Patient Re-evaluated:Patient Re-evaluated prior to induction Oxygen Delivery Method: Circle system utilized Preoxygenation: Pre-oxygenation with 100% oxygen Induction Type: IV induction Ventilation: Mask ventilation without difficulty Laryngoscope Size: Miller and 2 Grade View: Grade I Tube type: Oral Tube size: 7.0 mm Number of attempts: 1 Airway Equipment and Method: Stylet and Oral airway Placement Confirmation: ETT inserted through vocal cords under direct vision,  positive ETCO2 and breath sounds checked- equal and bilateral Secured at: 20 cm Tube secured with: Tape Dental Injury: Teeth and Oropharynx as per pre-operative assessment

## 2019-11-14 NOTE — Interval H&P Note (Signed)
History and Physical Interval Note:  11/14/2019 9:00 AM  Angela Daugherty  has presented today for surgery, with the diagnosis of RIGHT KNEE CHONDROMALACIA PATELLA, [place], M22.41 S83.249A.  The various methods of treatment have been discussed with the patient and family. After consideration of risks, benefits and other options for treatment, the patient has consented to  Procedure(s): KNEE ARTHROSCOPY WITH MEDIAL MENISECTOMY AND CHONDROPLASTY (Right) as a surgical intervention.  The patient's history has been reviewed, patient examined, no change in status, stable for surgery.  I have reviewed the patient's chart and labs.  Questions were answered to the patient's satisfaction.     Hiram Gash

## 2019-11-14 NOTE — Discharge Instructions (Signed)
  Post Anesthesia Home Care Instructions  Activity: Get plenty of rest for the remainder of the day. A responsible individual must stay with you for 24 hours following the procedure.  For the next 24 hours, DO NOT: -Drive a car -Paediatric nurse -Drink alcoholic beverages -Take any medication unless instructed by your physician -Make any legal decisions or sign important papers.  Meals: Start with liquid foods such as gelatin or soup. Progress to regular foods as tolerated. Avoid greasy, spicy, heavy foods. If nausea and/or vomiting occur, drink only clear liquids until the nausea and/or vomiting subsides. Call your physician if vomiting continues.  Special Instructions/Symptoms: Your throat may feel dry or sore from the anesthesia or the breathing tube placed in your throat during surgery. If this causes discomfort, gargle with warm salt water. The discomfort should disappear within 24 hours.  If you had a scopolamine patch placed behind your ear for the management of post- operative nausea and/or vomiting:  1. The medication in the patch is effective for 72 hours, after which it should be removed.  Wrap patch in a tissue and discard in the trash. Wash hands thoroughly with soap and water. 2. You may remove the patch earlier than 72 hours if you experience unpleasant side effects which may include dry mouth, dizziness or visual disturbances. 3. Avoid touching the patch. Wash your hands with soap and water after contact with the patch.      NO TYLENOL PRODUCTS UNTIL 2:00PM  NO IBUPROFEN / ADVIL UNTIL 4:00 PM

## 2019-11-14 NOTE — Op Note (Signed)
Orthopaedic Surgery Operative Note (CSN: [id number])  Angela Daugherty  1970/08/09 Date of Surgery: 11/14/2019   Diagnoses:  RIGHT KNEE CHONDROMALACIA PATELLA, MEDIAL MENISCUS TEAR, M22.41 S83.249A  Procedure: Right knee loose body excision Right knee medial meniscectomy and medial femoral condyle chondroplasty   Operative Finding Exam under anesthesia: Patient lacked about 3 degrees short of full extension, full flexion was noted 130 degrees ligamentously stable Suprapatellar pouch: Normal Patellofemoral Compartment: Mild grade 2 changes in the central trochlea as well as on the patella debrided back. Medial Compartment: Grade 3 changes scattered with some mild grade 2 changes more diffusely.  This is mostly on the medial femoral condyle.  Complex posterior medial meniscus tear.  Back to stable base, complete radial tear was noted and about 50% total meniscal volume was resected however there were essentially no residual longitudinal fibers due to the radial tear. Lateral Compartment: Normal Intercondylar Notch: Normal  Successful completion of the planned procedure.  Joint appeared relatively intact though had some moderate arthritis.  Patient's lateral compartment and patellofemoral changes were fairly minimal and she may be a candidate for unicompartmental arthroplasty if she has medial failure.  Post-operative plan: The patient will be weightbearing to tolerance.  The patient will be discharged home.  DVT prophylaxis Aspirin 81 mg twice daily for 6 weeks.  Pain control with PRN pain medication preferring oral medicines.  Follow up plan will be scheduled in approximately 7 days for incision check and XR.  Post-Op Diagnosis: Same Surgeons:Primary: Hiram Gash, MD Assistants:Caroline McBane PA-C Location: [facility] ROOM 3 Anesthesia: General with local Antibiotics: Ancef 2 g Tourniquet time: * No tourniquets in log * Estimated Blood Loss: Minimal Complications: None Specimens:  None Implants: * No implants in log *  Indications for Surgery:   Angela Daugherty is a 50 y.o. female with continued medial sided knee pain and catching and locking in the setting of mild arthritis.  We did discuss that she may have continued arthritic based pain but we could try and manage her catching and locking.  Benefits and risks of operative and nonoperative management were discussed prior to surgery with patient/guardian(s) and informed consent form was completed.  Specific risks including infection, need for additional surgery, postoperative arthrosis, continued pain amongst others     Procedure:   The patient was identified properly. Informed consent was obtained and the surgical site was marked. The patient was taken up to suite where general anesthesia was induced. The patient was placed in the supine position with a post against the surgical leg and a nonsterile tourniquet applied. The surgical leg was then prepped and draped usual sterile fashion.  A standard surgical timeout was performed.  2 standard anterior portals were made and diagnostic arthroscopy performed. Please note the findings as noted above.  A 1 x 1 cm loose bodies removed with a locking grasper from the medial compartment which was pinned inside the joint.  We cleared the joint about loose bodies.  Gentle chondroplasty with a shaver was performed of the medial femoral condyle and the trochlea.  Medial meniscus was debrided back to a stable base using a shaver and a basket.  Incisions closed with absorbable suture. The patient was awoken from general anesthesia and taken to the PACU in stable condition without complication.   Noemi Chapel, PA-C, present and scrubbed throughout the case, critical for completion in a timely fashion, and for retraction, instrumentation, closure.

## 2019-11-15 ENCOUNTER — Encounter (HOSPITAL_BASED_OUTPATIENT_CLINIC_OR_DEPARTMENT_OTHER): Payer: Self-pay | Admitting: Orthopaedic Surgery

## 2019-11-22 DIAGNOSIS — S83241D Other tear of medial meniscus, current injury, right knee, subsequent encounter: Secondary | ICD-10-CM | POA: Diagnosis not present

## 2020-03-17 DIAGNOSIS — Z1152 Encounter for screening for COVID-19: Secondary | ICD-10-CM | POA: Diagnosis not present

## 2020-05-28 DIAGNOSIS — E119 Type 2 diabetes mellitus without complications: Secondary | ICD-10-CM | POA: Diagnosis not present

## 2020-05-28 DIAGNOSIS — J45909 Unspecified asthma, uncomplicated: Secondary | ICD-10-CM | POA: Diagnosis not present

## 2020-05-28 DIAGNOSIS — J4520 Mild intermittent asthma, uncomplicated: Secondary | ICD-10-CM | POA: Diagnosis not present

## 2020-05-28 DIAGNOSIS — K219 Gastro-esophageal reflux disease without esophagitis: Secondary | ICD-10-CM | POA: Diagnosis not present

## 2020-05-28 DIAGNOSIS — E1140 Type 2 diabetes mellitus with diabetic neuropathy, unspecified: Secondary | ICD-10-CM | POA: Diagnosis not present

## 2020-05-28 DIAGNOSIS — J3089 Other allergic rhinitis: Secondary | ICD-10-CM | POA: Diagnosis not present

## 2020-05-28 DIAGNOSIS — I10 Essential (primary) hypertension: Secondary | ICD-10-CM | POA: Diagnosis not present

## 2020-05-28 DIAGNOSIS — E1165 Type 2 diabetes mellitus with hyperglycemia: Secondary | ICD-10-CM | POA: Diagnosis not present

## 2020-05-28 DIAGNOSIS — L239 Allergic contact dermatitis, unspecified cause: Secondary | ICD-10-CM | POA: Diagnosis not present

## 2020-06-16 DIAGNOSIS — J45909 Unspecified asthma, uncomplicated: Secondary | ICD-10-CM | POA: Diagnosis not present

## 2020-06-16 DIAGNOSIS — I10 Essential (primary) hypertension: Secondary | ICD-10-CM | POA: Diagnosis not present

## 2020-06-16 DIAGNOSIS — E119 Type 2 diabetes mellitus without complications: Secondary | ICD-10-CM | POA: Diagnosis not present

## 2020-06-16 DIAGNOSIS — E1140 Type 2 diabetes mellitus with diabetic neuropathy, unspecified: Secondary | ICD-10-CM | POA: Diagnosis not present

## 2020-06-16 DIAGNOSIS — J4520 Mild intermittent asthma, uncomplicated: Secondary | ICD-10-CM | POA: Diagnosis not present

## 2020-06-16 DIAGNOSIS — E1165 Type 2 diabetes mellitus with hyperglycemia: Secondary | ICD-10-CM | POA: Diagnosis not present

## 2020-06-16 DIAGNOSIS — M1711 Unilateral primary osteoarthritis, right knee: Secondary | ICD-10-CM | POA: Diagnosis not present

## 2020-06-16 DIAGNOSIS — L239 Allergic contact dermatitis, unspecified cause: Secondary | ICD-10-CM | POA: Diagnosis not present

## 2020-06-16 DIAGNOSIS — K219 Gastro-esophageal reflux disease without esophagitis: Secondary | ICD-10-CM | POA: Diagnosis not present

## 2020-06-16 DIAGNOSIS — J3089 Other allergic rhinitis: Secondary | ICD-10-CM | POA: Diagnosis not present

## 2020-06-23 DIAGNOSIS — S83241D Other tear of medial meniscus, current injury, right knee, subsequent encounter: Secondary | ICD-10-CM | POA: Diagnosis not present

## 2020-10-12 DIAGNOSIS — E1140 Type 2 diabetes mellitus with diabetic neuropathy, unspecified: Secondary | ICD-10-CM | POA: Diagnosis not present

## 2020-10-12 DIAGNOSIS — E1165 Type 2 diabetes mellitus with hyperglycemia: Secondary | ICD-10-CM | POA: Diagnosis not present

## 2020-10-12 DIAGNOSIS — U071 COVID-19: Secondary | ICD-10-CM | POA: Diagnosis not present

## 2020-10-12 DIAGNOSIS — I10 Essential (primary) hypertension: Secondary | ICD-10-CM | POA: Diagnosis not present

## 2020-10-12 DIAGNOSIS — J4520 Mild intermittent asthma, uncomplicated: Secondary | ICD-10-CM | POA: Diagnosis not present

## 2020-10-12 DIAGNOSIS — K219 Gastro-esophageal reflux disease without esophagitis: Secondary | ICD-10-CM | POA: Diagnosis not present

## 2020-10-12 DIAGNOSIS — J3089 Other allergic rhinitis: Secondary | ICD-10-CM | POA: Diagnosis not present

## 2020-12-01 DIAGNOSIS — E1140 Type 2 diabetes mellitus with diabetic neuropathy, unspecified: Secondary | ICD-10-CM | POA: Diagnosis not present

## 2020-12-01 DIAGNOSIS — J4520 Mild intermittent asthma, uncomplicated: Secondary | ICD-10-CM | POA: Diagnosis not present

## 2020-12-01 DIAGNOSIS — E1165 Type 2 diabetes mellitus with hyperglycemia: Secondary | ICD-10-CM | POA: Diagnosis not present

## 2020-12-01 DIAGNOSIS — K219 Gastro-esophageal reflux disease without esophagitis: Secondary | ICD-10-CM | POA: Diagnosis not present

## 2020-12-01 DIAGNOSIS — J3089 Other allergic rhinitis: Secondary | ICD-10-CM | POA: Diagnosis not present

## 2020-12-01 DIAGNOSIS — I10 Essential (primary) hypertension: Secondary | ICD-10-CM | POA: Diagnosis not present

## 2021-01-20 ENCOUNTER — Other Ambulatory Visit: Payer: Self-pay | Admitting: Physician Assistant

## 2021-01-20 DIAGNOSIS — Z1231 Encounter for screening mammogram for malignant neoplasm of breast: Secondary | ICD-10-CM

## 2021-02-16 ENCOUNTER — Encounter: Payer: Self-pay | Admitting: Internal Medicine

## 2021-03-12 DIAGNOSIS — E1165 Type 2 diabetes mellitus with hyperglycemia: Secondary | ICD-10-CM | POA: Diagnosis not present

## 2021-03-12 DIAGNOSIS — J3089 Other allergic rhinitis: Secondary | ICD-10-CM | POA: Diagnosis not present

## 2021-03-12 DIAGNOSIS — M479 Spondylosis, unspecified: Secondary | ICD-10-CM | POA: Diagnosis not present

## 2021-03-12 DIAGNOSIS — I10 Essential (primary) hypertension: Secondary | ICD-10-CM | POA: Diagnosis not present

## 2021-03-12 DIAGNOSIS — S39012A Strain of muscle, fascia and tendon of lower back, initial encounter: Secondary | ICD-10-CM | POA: Diagnosis not present

## 2021-03-12 DIAGNOSIS — J4520 Mild intermittent asthma, uncomplicated: Secondary | ICD-10-CM | POA: Diagnosis not present

## 2021-03-12 DIAGNOSIS — K219 Gastro-esophageal reflux disease without esophagitis: Secondary | ICD-10-CM | POA: Diagnosis not present

## 2021-03-12 DIAGNOSIS — E1140 Type 2 diabetes mellitus with diabetic neuropathy, unspecified: Secondary | ICD-10-CM | POA: Diagnosis not present

## 2021-04-13 DIAGNOSIS — J3089 Other allergic rhinitis: Secondary | ICD-10-CM | POA: Diagnosis not present

## 2021-04-13 DIAGNOSIS — I10 Essential (primary) hypertension: Secondary | ICD-10-CM | POA: Diagnosis not present

## 2021-04-13 DIAGNOSIS — E1165 Type 2 diabetes mellitus with hyperglycemia: Secondary | ICD-10-CM | POA: Diagnosis not present

## 2021-04-13 DIAGNOSIS — E1140 Type 2 diabetes mellitus with diabetic neuropathy, unspecified: Secondary | ICD-10-CM | POA: Diagnosis not present

## 2021-04-13 DIAGNOSIS — J4520 Mild intermittent asthma, uncomplicated: Secondary | ICD-10-CM | POA: Diagnosis not present

## 2021-04-13 DIAGNOSIS — K219 Gastro-esophageal reflux disease without esophagitis: Secondary | ICD-10-CM | POA: Diagnosis not present

## 2021-04-13 DIAGNOSIS — M479 Spondylosis, unspecified: Secondary | ICD-10-CM | POA: Diagnosis not present

## 2021-11-23 DIAGNOSIS — E1165 Type 2 diabetes mellitus with hyperglycemia: Secondary | ICD-10-CM | POA: Diagnosis not present

## 2021-11-23 DIAGNOSIS — I10 Essential (primary) hypertension: Secondary | ICD-10-CM | POA: Diagnosis not present

## 2021-11-23 DIAGNOSIS — E1140 Type 2 diabetes mellitus with diabetic neuropathy, unspecified: Secondary | ICD-10-CM | POA: Diagnosis not present

## 2021-11-23 DIAGNOSIS — Z0001 Encounter for general adult medical examination with abnormal findings: Secondary | ICD-10-CM | POA: Diagnosis not present

## 2021-11-23 DIAGNOSIS — K219 Gastro-esophageal reflux disease without esophagitis: Secondary | ICD-10-CM | POA: Diagnosis not present

## 2021-11-23 DIAGNOSIS — J3089 Other allergic rhinitis: Secondary | ICD-10-CM | POA: Diagnosis not present

## 2021-11-23 DIAGNOSIS — M479 Spondylosis, unspecified: Secondary | ICD-10-CM | POA: Diagnosis not present

## 2021-11-23 DIAGNOSIS — J4520 Mild intermittent asthma, uncomplicated: Secondary | ICD-10-CM | POA: Diagnosis not present

## 2021-11-25 ENCOUNTER — Other Ambulatory Visit: Payer: Self-pay | Admitting: Physician Assistant

## 2021-11-25 DIAGNOSIS — Z1231 Encounter for screening mammogram for malignant neoplasm of breast: Secondary | ICD-10-CM

## 2021-12-07 DIAGNOSIS — I10 Essential (primary) hypertension: Secondary | ICD-10-CM | POA: Diagnosis not present

## 2021-12-07 DIAGNOSIS — E1165 Type 2 diabetes mellitus with hyperglycemia: Secondary | ICD-10-CM | POA: Diagnosis not present

## 2021-12-07 DIAGNOSIS — K219 Gastro-esophageal reflux disease without esophagitis: Secondary | ICD-10-CM | POA: Diagnosis not present

## 2021-12-07 DIAGNOSIS — E1140 Type 2 diabetes mellitus with diabetic neuropathy, unspecified: Secondary | ICD-10-CM | POA: Diagnosis not present

## 2021-12-07 DIAGNOSIS — Z0000 Encounter for general adult medical examination without abnormal findings: Secondary | ICD-10-CM | POA: Diagnosis not present

## 2021-12-07 DIAGNOSIS — J4520 Mild intermittent asthma, uncomplicated: Secondary | ICD-10-CM | POA: Diagnosis not present

## 2021-12-07 DIAGNOSIS — Z23 Encounter for immunization: Secondary | ICD-10-CM | POA: Diagnosis not present

## 2021-12-07 DIAGNOSIS — M479 Spondylosis, unspecified: Secondary | ICD-10-CM | POA: Diagnosis not present

## 2021-12-07 DIAGNOSIS — J3089 Other allergic rhinitis: Secondary | ICD-10-CM | POA: Diagnosis not present

## 2021-12-20 DIAGNOSIS — J4520 Mild intermittent asthma, uncomplicated: Secondary | ICD-10-CM | POA: Diagnosis not present

## 2021-12-20 DIAGNOSIS — E1165 Type 2 diabetes mellitus with hyperglycemia: Secondary | ICD-10-CM | POA: Diagnosis not present

## 2021-12-20 DIAGNOSIS — K219 Gastro-esophageal reflux disease without esophagitis: Secondary | ICD-10-CM | POA: Diagnosis not present

## 2021-12-20 DIAGNOSIS — Z23 Encounter for immunization: Secondary | ICD-10-CM | POA: Diagnosis not present

## 2021-12-20 DIAGNOSIS — E1140 Type 2 diabetes mellitus with diabetic neuropathy, unspecified: Secondary | ICD-10-CM | POA: Diagnosis not present

## 2021-12-20 DIAGNOSIS — J3089 Other allergic rhinitis: Secondary | ICD-10-CM | POA: Diagnosis not present

## 2021-12-20 DIAGNOSIS — I10 Essential (primary) hypertension: Secondary | ICD-10-CM | POA: Diagnosis not present

## 2021-12-20 DIAGNOSIS — M479 Spondylosis, unspecified: Secondary | ICD-10-CM | POA: Diagnosis not present

## 2022-03-14 DIAGNOSIS — J3089 Other allergic rhinitis: Secondary | ICD-10-CM | POA: Diagnosis not present

## 2022-03-14 DIAGNOSIS — E782 Mixed hyperlipidemia: Secondary | ICD-10-CM | POA: Diagnosis not present

## 2022-03-14 DIAGNOSIS — I10 Essential (primary) hypertension: Secondary | ICD-10-CM | POA: Diagnosis not present

## 2022-03-14 DIAGNOSIS — E1165 Type 2 diabetes mellitus with hyperglycemia: Secondary | ICD-10-CM | POA: Diagnosis not present

## 2022-03-14 DIAGNOSIS — E1140 Type 2 diabetes mellitus with diabetic neuropathy, unspecified: Secondary | ICD-10-CM | POA: Diagnosis not present

## 2022-03-14 DIAGNOSIS — J4520 Mild intermittent asthma, uncomplicated: Secondary | ICD-10-CM | POA: Diagnosis not present

## 2022-03-14 DIAGNOSIS — M479 Spondylosis, unspecified: Secondary | ICD-10-CM | POA: Diagnosis not present

## 2022-03-14 DIAGNOSIS — K219 Gastro-esophageal reflux disease without esophagitis: Secondary | ICD-10-CM | POA: Diagnosis not present

## 2022-04-28 DIAGNOSIS — M479 Spondylosis, unspecified: Secondary | ICD-10-CM | POA: Diagnosis not present

## 2022-04-28 DIAGNOSIS — K219 Gastro-esophageal reflux disease without esophagitis: Secondary | ICD-10-CM | POA: Diagnosis not present

## 2022-04-28 DIAGNOSIS — E782 Mixed hyperlipidemia: Secondary | ICD-10-CM | POA: Diagnosis not present

## 2022-04-28 DIAGNOSIS — I10 Essential (primary) hypertension: Secondary | ICD-10-CM | POA: Diagnosis not present

## 2022-04-28 DIAGNOSIS — J4520 Mild intermittent asthma, uncomplicated: Secondary | ICD-10-CM | POA: Diagnosis not present

## 2022-04-28 DIAGNOSIS — J3089 Other allergic rhinitis: Secondary | ICD-10-CM | POA: Diagnosis not present

## 2022-04-28 DIAGNOSIS — E1140 Type 2 diabetes mellitus with diabetic neuropathy, unspecified: Secondary | ICD-10-CM | POA: Diagnosis not present

## 2022-04-28 DIAGNOSIS — E1165 Type 2 diabetes mellitus with hyperglycemia: Secondary | ICD-10-CM | POA: Diagnosis not present

## 2022-05-16 ENCOUNTER — Ambulatory Visit: Payer: 59 | Admitting: Podiatry

## 2022-06-13 DIAGNOSIS — M479 Spondylosis, unspecified: Secondary | ICD-10-CM | POA: Diagnosis not present

## 2022-06-13 DIAGNOSIS — I10 Essential (primary) hypertension: Secondary | ICD-10-CM | POA: Diagnosis not present

## 2022-06-13 DIAGNOSIS — E1165 Type 2 diabetes mellitus with hyperglycemia: Secondary | ICD-10-CM | POA: Diagnosis not present

## 2022-06-13 DIAGNOSIS — J4520 Mild intermittent asthma, uncomplicated: Secondary | ICD-10-CM | POA: Diagnosis not present

## 2022-06-13 DIAGNOSIS — K219 Gastro-esophageal reflux disease without esophagitis: Secondary | ICD-10-CM | POA: Diagnosis not present

## 2022-06-13 DIAGNOSIS — J3089 Other allergic rhinitis: Secondary | ICD-10-CM | POA: Diagnosis not present

## 2022-06-13 DIAGNOSIS — E782 Mixed hyperlipidemia: Secondary | ICD-10-CM | POA: Diagnosis not present

## 2022-06-13 DIAGNOSIS — E1140 Type 2 diabetes mellitus with diabetic neuropathy, unspecified: Secondary | ICD-10-CM | POA: Diagnosis not present

## 2022-06-14 ENCOUNTER — Other Ambulatory Visit: Payer: Self-pay | Admitting: Internal Medicine

## 2022-06-14 DIAGNOSIS — I10 Essential (primary) hypertension: Secondary | ICD-10-CM | POA: Diagnosis not present

## 2022-06-14 DIAGNOSIS — E1165 Type 2 diabetes mellitus with hyperglycemia: Secondary | ICD-10-CM | POA: Diagnosis not present

## 2022-06-14 DIAGNOSIS — E1140 Type 2 diabetes mellitus with diabetic neuropathy, unspecified: Secondary | ICD-10-CM | POA: Diagnosis not present

## 2022-06-14 DIAGNOSIS — Z1231 Encounter for screening mammogram for malignant neoplasm of breast: Secondary | ICD-10-CM

## 2022-06-14 DIAGNOSIS — J4520 Mild intermittent asthma, uncomplicated: Secondary | ICD-10-CM | POA: Diagnosis not present

## 2022-06-14 DIAGNOSIS — E782 Mixed hyperlipidemia: Secondary | ICD-10-CM | POA: Diagnosis not present

## 2022-08-09 DIAGNOSIS — E1165 Type 2 diabetes mellitus with hyperglycemia: Secondary | ICD-10-CM | POA: Diagnosis not present

## 2022-08-24 ENCOUNTER — Ambulatory Visit
Admission: RE | Admit: 2022-08-24 | Discharge: 2022-08-24 | Disposition: A | Discharge status: Home / Self Care | Payer: 59 | Source: Ambulatory Visit | Attending: Internal Medicine | Admitting: Internal Medicine

## 2022-08-24 DIAGNOSIS — Z1231 Encounter for screening mammogram for malignant neoplasm of breast: Secondary | ICD-10-CM | POA: Diagnosis not present

## 2022-08-30 DIAGNOSIS — E1140 Type 2 diabetes mellitus with diabetic neuropathy, unspecified: Secondary | ICD-10-CM | POA: Diagnosis not present

## 2022-08-30 DIAGNOSIS — E1165 Type 2 diabetes mellitus with hyperglycemia: Secondary | ICD-10-CM | POA: Diagnosis not present

## 2022-08-30 DIAGNOSIS — I10 Essential (primary) hypertension: Secondary | ICD-10-CM | POA: Diagnosis not present

## 2022-08-30 DIAGNOSIS — K219 Gastro-esophageal reflux disease without esophagitis: Secondary | ICD-10-CM | POA: Diagnosis not present

## 2022-08-30 DIAGNOSIS — R051 Acute cough: Secondary | ICD-10-CM | POA: Diagnosis not present

## 2022-08-30 DIAGNOSIS — J3089 Other allergic rhinitis: Secondary | ICD-10-CM | POA: Diagnosis not present

## 2022-08-30 DIAGNOSIS — J4520 Mild intermittent asthma, uncomplicated: Secondary | ICD-10-CM | POA: Diagnosis not present

## 2022-08-30 DIAGNOSIS — M479 Spondylosis, unspecified: Secondary | ICD-10-CM | POA: Diagnosis not present

## 2022-08-30 DIAGNOSIS — E782 Mixed hyperlipidemia: Secondary | ICD-10-CM | POA: Diagnosis not present

## 2022-09-12 DIAGNOSIS — J3089 Other allergic rhinitis: Secondary | ICD-10-CM | POA: Diagnosis not present

## 2022-09-12 DIAGNOSIS — M479 Spondylosis, unspecified: Secondary | ICD-10-CM | POA: Diagnosis not present

## 2022-09-12 DIAGNOSIS — E1165 Type 2 diabetes mellitus with hyperglycemia: Secondary | ICD-10-CM | POA: Diagnosis not present

## 2022-09-12 DIAGNOSIS — K219 Gastro-esophageal reflux disease without esophagitis: Secondary | ICD-10-CM | POA: Diagnosis not present

## 2022-09-12 DIAGNOSIS — E782 Mixed hyperlipidemia: Secondary | ICD-10-CM | POA: Diagnosis not present

## 2022-09-12 DIAGNOSIS — J4520 Mild intermittent asthma, uncomplicated: Secondary | ICD-10-CM | POA: Diagnosis not present

## 2022-09-12 DIAGNOSIS — E1140 Type 2 diabetes mellitus with diabetic neuropathy, unspecified: Secondary | ICD-10-CM | POA: Diagnosis not present

## 2022-09-12 DIAGNOSIS — I10 Essential (primary) hypertension: Secondary | ICD-10-CM | POA: Diagnosis not present

## 2022-10-20 DIAGNOSIS — J3089 Other allergic rhinitis: Secondary | ICD-10-CM | POA: Diagnosis not present

## 2022-10-20 DIAGNOSIS — I10 Essential (primary) hypertension: Secondary | ICD-10-CM | POA: Diagnosis not present

## 2022-10-20 DIAGNOSIS — M479 Spondylosis, unspecified: Secondary | ICD-10-CM | POA: Diagnosis not present

## 2022-10-20 DIAGNOSIS — R051 Acute cough: Secondary | ICD-10-CM | POA: Diagnosis not present

## 2022-10-20 DIAGNOSIS — E1165 Type 2 diabetes mellitus with hyperglycemia: Secondary | ICD-10-CM | POA: Diagnosis not present

## 2022-10-20 DIAGNOSIS — E782 Mixed hyperlipidemia: Secondary | ICD-10-CM | POA: Diagnosis not present

## 2022-10-20 DIAGNOSIS — Z794 Long term (current) use of insulin: Secondary | ICD-10-CM | POA: Diagnosis not present

## 2022-10-20 DIAGNOSIS — K219 Gastro-esophageal reflux disease without esophagitis: Secondary | ICD-10-CM | POA: Diagnosis not present

## 2022-10-20 DIAGNOSIS — E1140 Type 2 diabetes mellitus with diabetic neuropathy, unspecified: Secondary | ICD-10-CM | POA: Diagnosis not present

## 2022-10-20 DIAGNOSIS — J4520 Mild intermittent asthma, uncomplicated: Secondary | ICD-10-CM | POA: Diagnosis not present

## 2022-11-04 DIAGNOSIS — E1165 Type 2 diabetes mellitus with hyperglycemia: Secondary | ICD-10-CM | POA: Diagnosis not present

## 2022-11-04 DIAGNOSIS — E782 Mixed hyperlipidemia: Secondary | ICD-10-CM | POA: Diagnosis not present

## 2022-11-04 DIAGNOSIS — E1140 Type 2 diabetes mellitus with diabetic neuropathy, unspecified: Secondary | ICD-10-CM | POA: Diagnosis not present

## 2022-11-04 DIAGNOSIS — K219 Gastro-esophageal reflux disease without esophagitis: Secondary | ICD-10-CM | POA: Diagnosis not present

## 2022-11-04 DIAGNOSIS — J4520 Mild intermittent asthma, uncomplicated: Secondary | ICD-10-CM | POA: Diagnosis not present

## 2022-11-04 DIAGNOSIS — M479 Spondylosis, unspecified: Secondary | ICD-10-CM | POA: Diagnosis not present

## 2022-11-04 DIAGNOSIS — J3089 Other allergic rhinitis: Secondary | ICD-10-CM | POA: Diagnosis not present

## 2022-11-04 DIAGNOSIS — I10 Essential (primary) hypertension: Secondary | ICD-10-CM | POA: Diagnosis not present

## 2022-12-15 DIAGNOSIS — Z0000 Encounter for general adult medical examination without abnormal findings: Secondary | ICD-10-CM | POA: Diagnosis not present

## 2022-12-15 DIAGNOSIS — N3001 Acute cystitis with hematuria: Secondary | ICD-10-CM | POA: Diagnosis not present

## 2022-12-15 DIAGNOSIS — R300 Dysuria: Secondary | ICD-10-CM | POA: Diagnosis not present

## 2022-12-22 DIAGNOSIS — E1165 Type 2 diabetes mellitus with hyperglycemia: Secondary | ICD-10-CM | POA: Diagnosis not present

## 2022-12-22 DIAGNOSIS — E782 Mixed hyperlipidemia: Secondary | ICD-10-CM | POA: Diagnosis not present

## 2022-12-22 DIAGNOSIS — K219 Gastro-esophageal reflux disease without esophagitis: Secondary | ICD-10-CM | POA: Diagnosis not present

## 2022-12-22 DIAGNOSIS — M479 Spondylosis, unspecified: Secondary | ICD-10-CM | POA: Diagnosis not present

## 2022-12-22 DIAGNOSIS — E1140 Type 2 diabetes mellitus with diabetic neuropathy, unspecified: Secondary | ICD-10-CM | POA: Diagnosis not present

## 2022-12-22 DIAGNOSIS — I10 Essential (primary) hypertension: Secondary | ICD-10-CM | POA: Diagnosis not present

## 2022-12-22 DIAGNOSIS — J3089 Other allergic rhinitis: Secondary | ICD-10-CM | POA: Diagnosis not present

## 2022-12-22 DIAGNOSIS — J4520 Mild intermittent asthma, uncomplicated: Secondary | ICD-10-CM | POA: Diagnosis not present

## 2023-03-23 DIAGNOSIS — Z1211 Encounter for screening for malignant neoplasm of colon: Secondary | ICD-10-CM | POA: Diagnosis not present

## 2023-03-23 DIAGNOSIS — Z1212 Encounter for screening for malignant neoplasm of rectum: Secondary | ICD-10-CM | POA: Diagnosis not present

## 2023-03-31 DIAGNOSIS — E1140 Type 2 diabetes mellitus with diabetic neuropathy, unspecified: Secondary | ICD-10-CM | POA: Diagnosis not present

## 2023-03-31 DIAGNOSIS — Z0000 Encounter for general adult medical examination without abnormal findings: Secondary | ICD-10-CM | POA: Diagnosis not present

## 2023-03-31 DIAGNOSIS — J3089 Other allergic rhinitis: Secondary | ICD-10-CM | POA: Diagnosis not present

## 2023-03-31 DIAGNOSIS — E1165 Type 2 diabetes mellitus with hyperglycemia: Secondary | ICD-10-CM | POA: Diagnosis not present

## 2023-03-31 DIAGNOSIS — M479 Spondylosis, unspecified: Secondary | ICD-10-CM | POA: Diagnosis not present

## 2023-03-31 DIAGNOSIS — E782 Mixed hyperlipidemia: Secondary | ICD-10-CM | POA: Diagnosis not present

## 2023-03-31 DIAGNOSIS — K219 Gastro-esophageal reflux disease without esophagitis: Secondary | ICD-10-CM | POA: Diagnosis not present

## 2023-03-31 DIAGNOSIS — I10 Essential (primary) hypertension: Secondary | ICD-10-CM | POA: Diagnosis not present

## 2023-03-31 DIAGNOSIS — J4520 Mild intermittent asthma, uncomplicated: Secondary | ICD-10-CM | POA: Diagnosis not present

## 2023-04-14 DIAGNOSIS — E1165 Type 2 diabetes mellitus with hyperglycemia: Secondary | ICD-10-CM | POA: Diagnosis not present

## 2023-04-14 DIAGNOSIS — E782 Mixed hyperlipidemia: Secondary | ICD-10-CM | POA: Diagnosis not present

## 2023-04-14 DIAGNOSIS — M479 Spondylosis, unspecified: Secondary | ICD-10-CM | POA: Diagnosis not present

## 2023-04-14 DIAGNOSIS — J3089 Other allergic rhinitis: Secondary | ICD-10-CM | POA: Diagnosis not present

## 2023-04-14 DIAGNOSIS — I10 Essential (primary) hypertension: Secondary | ICD-10-CM | POA: Diagnosis not present

## 2023-04-14 DIAGNOSIS — J4520 Mild intermittent asthma, uncomplicated: Secondary | ICD-10-CM | POA: Diagnosis not present

## 2023-04-14 DIAGNOSIS — E1140 Type 2 diabetes mellitus with diabetic neuropathy, unspecified: Secondary | ICD-10-CM | POA: Diagnosis not present

## 2023-04-14 DIAGNOSIS — K219 Gastro-esophageal reflux disease without esophagitis: Secondary | ICD-10-CM | POA: Diagnosis not present

## 2023-05-11 DIAGNOSIS — M25551 Pain in right hip: Secondary | ICD-10-CM | POA: Diagnosis not present

## 2023-05-11 DIAGNOSIS — M25561 Pain in right knee: Secondary | ICD-10-CM | POA: Diagnosis not present

## 2023-05-26 DIAGNOSIS — M25561 Pain in right knee: Secondary | ICD-10-CM | POA: Diagnosis not present

## 2023-07-14 DIAGNOSIS — E1140 Type 2 diabetes mellitus with diabetic neuropathy, unspecified: Secondary | ICD-10-CM | POA: Diagnosis not present

## 2023-07-14 DIAGNOSIS — I10 Essential (primary) hypertension: Secondary | ICD-10-CM | POA: Diagnosis not present

## 2023-07-14 DIAGNOSIS — M479 Spondylosis, unspecified: Secondary | ICD-10-CM | POA: Diagnosis not present

## 2023-07-14 DIAGNOSIS — E1165 Type 2 diabetes mellitus with hyperglycemia: Secondary | ICD-10-CM | POA: Diagnosis not present

## 2023-07-14 DIAGNOSIS — J3089 Other allergic rhinitis: Secondary | ICD-10-CM | POA: Diagnosis not present

## 2023-07-14 DIAGNOSIS — J4520 Mild intermittent asthma, uncomplicated: Secondary | ICD-10-CM | POA: Diagnosis not present

## 2023-07-14 DIAGNOSIS — K219 Gastro-esophageal reflux disease without esophagitis: Secondary | ICD-10-CM | POA: Diagnosis not present

## 2023-07-14 DIAGNOSIS — E782 Mixed hyperlipidemia: Secondary | ICD-10-CM | POA: Diagnosis not present

## 2023-07-28 DIAGNOSIS — E1140 Type 2 diabetes mellitus with diabetic neuropathy, unspecified: Secondary | ICD-10-CM | POA: Diagnosis not present

## 2023-07-28 DIAGNOSIS — E1165 Type 2 diabetes mellitus with hyperglycemia: Secondary | ICD-10-CM | POA: Diagnosis not present

## 2023-07-28 DIAGNOSIS — E782 Mixed hyperlipidemia: Secondary | ICD-10-CM | POA: Diagnosis not present

## 2023-07-28 DIAGNOSIS — J4520 Mild intermittent asthma, uncomplicated: Secondary | ICD-10-CM | POA: Diagnosis not present

## 2023-07-28 DIAGNOSIS — J3089 Other allergic rhinitis: Secondary | ICD-10-CM | POA: Diagnosis not present

## 2023-08-18 DIAGNOSIS — K219 Gastro-esophageal reflux disease without esophagitis: Secondary | ICD-10-CM | POA: Diagnosis not present

## 2023-08-18 DIAGNOSIS — R058 Other specified cough: Secondary | ICD-10-CM | POA: Diagnosis not present

## 2023-08-18 DIAGNOSIS — E782 Mixed hyperlipidemia: Secondary | ICD-10-CM | POA: Diagnosis not present

## 2023-08-18 DIAGNOSIS — Z0001 Encounter for general adult medical examination with abnormal findings: Secondary | ICD-10-CM | POA: Diagnosis not present

## 2023-08-18 DIAGNOSIS — J4520 Mild intermittent asthma, uncomplicated: Secondary | ICD-10-CM | POA: Diagnosis not present

## 2023-08-18 DIAGNOSIS — I10 Essential (primary) hypertension: Secondary | ICD-10-CM | POA: Diagnosis not present

## 2023-08-18 DIAGNOSIS — E1140 Type 2 diabetes mellitus with diabetic neuropathy, unspecified: Secondary | ICD-10-CM | POA: Diagnosis not present

## 2023-08-18 DIAGNOSIS — E1165 Type 2 diabetes mellitus with hyperglycemia: Secondary | ICD-10-CM | POA: Diagnosis not present

## 2023-08-18 DIAGNOSIS — M479 Spondylosis, unspecified: Secondary | ICD-10-CM | POA: Diagnosis not present

## 2023-08-18 DIAGNOSIS — J3089 Other allergic rhinitis: Secondary | ICD-10-CM | POA: Diagnosis not present

## 2023-09-29 DIAGNOSIS — I10 Essential (primary) hypertension: Secondary | ICD-10-CM | POA: Diagnosis not present

## 2023-09-29 DIAGNOSIS — E1140 Type 2 diabetes mellitus with diabetic neuropathy, unspecified: Secondary | ICD-10-CM | POA: Diagnosis not present

## 2023-09-29 DIAGNOSIS — E782 Mixed hyperlipidemia: Secondary | ICD-10-CM | POA: Diagnosis not present

## 2023-09-29 DIAGNOSIS — E1165 Type 2 diabetes mellitus with hyperglycemia: Secondary | ICD-10-CM | POA: Diagnosis not present

## 2023-09-29 DIAGNOSIS — J3089 Other allergic rhinitis: Secondary | ICD-10-CM | POA: Diagnosis not present

## 2023-09-29 DIAGNOSIS — J4520 Mild intermittent asthma, uncomplicated: Secondary | ICD-10-CM | POA: Diagnosis not present

## 2023-09-29 DIAGNOSIS — M479 Spondylosis, unspecified: Secondary | ICD-10-CM | POA: Diagnosis not present

## 2023-09-29 DIAGNOSIS — K219 Gastro-esophageal reflux disease without esophagitis: Secondary | ICD-10-CM | POA: Diagnosis not present

## 2023-10-27 DIAGNOSIS — E1140 Type 2 diabetes mellitus with diabetic neuropathy, unspecified: Secondary | ICD-10-CM | POA: Diagnosis not present

## 2023-10-27 DIAGNOSIS — K219 Gastro-esophageal reflux disease without esophagitis: Secondary | ICD-10-CM | POA: Diagnosis not present

## 2023-10-27 DIAGNOSIS — J3089 Other allergic rhinitis: Secondary | ICD-10-CM | POA: Diagnosis not present

## 2023-10-27 DIAGNOSIS — E782 Mixed hyperlipidemia: Secondary | ICD-10-CM | POA: Diagnosis not present

## 2023-10-27 DIAGNOSIS — E1165 Type 2 diabetes mellitus with hyperglycemia: Secondary | ICD-10-CM | POA: Diagnosis not present

## 2023-10-27 DIAGNOSIS — M479 Spondylosis, unspecified: Secondary | ICD-10-CM | POA: Diagnosis not present

## 2023-10-27 DIAGNOSIS — I10 Essential (primary) hypertension: Secondary | ICD-10-CM | POA: Diagnosis not present

## 2023-10-27 DIAGNOSIS — J4520 Mild intermittent asthma, uncomplicated: Secondary | ICD-10-CM | POA: Diagnosis not present

## 2023-11-24 DIAGNOSIS — J3089 Other allergic rhinitis: Secondary | ICD-10-CM | POA: Diagnosis not present

## 2023-11-24 DIAGNOSIS — M479 Spondylosis, unspecified: Secondary | ICD-10-CM | POA: Diagnosis not present

## 2023-11-24 DIAGNOSIS — E1140 Type 2 diabetes mellitus with diabetic neuropathy, unspecified: Secondary | ICD-10-CM | POA: Diagnosis not present

## 2023-11-24 DIAGNOSIS — I10 Essential (primary) hypertension: Secondary | ICD-10-CM | POA: Diagnosis not present

## 2023-11-24 DIAGNOSIS — E1165 Type 2 diabetes mellitus with hyperglycemia: Secondary | ICD-10-CM | POA: Diagnosis not present

## 2023-11-24 DIAGNOSIS — E782 Mixed hyperlipidemia: Secondary | ICD-10-CM | POA: Diagnosis not present

## 2023-11-24 DIAGNOSIS — J4520 Mild intermittent asthma, uncomplicated: Secondary | ICD-10-CM | POA: Diagnosis not present

## 2023-11-24 DIAGNOSIS — K219 Gastro-esophageal reflux disease without esophagitis: Secondary | ICD-10-CM | POA: Diagnosis not present

## 2023-12-22 DIAGNOSIS — E782 Mixed hyperlipidemia: Secondary | ICD-10-CM | POA: Diagnosis not present

## 2023-12-22 DIAGNOSIS — E1165 Type 2 diabetes mellitus with hyperglycemia: Secondary | ICD-10-CM | POA: Diagnosis not present

## 2023-12-22 DIAGNOSIS — J3089 Other allergic rhinitis: Secondary | ICD-10-CM | POA: Diagnosis not present

## 2023-12-22 DIAGNOSIS — M479 Spondylosis, unspecified: Secondary | ICD-10-CM | POA: Diagnosis not present

## 2023-12-22 DIAGNOSIS — I10 Essential (primary) hypertension: Secondary | ICD-10-CM | POA: Diagnosis not present

## 2023-12-22 DIAGNOSIS — K219 Gastro-esophageal reflux disease without esophagitis: Secondary | ICD-10-CM | POA: Diagnosis not present

## 2023-12-22 DIAGNOSIS — E1140 Type 2 diabetes mellitus with diabetic neuropathy, unspecified: Secondary | ICD-10-CM | POA: Diagnosis not present

## 2023-12-22 DIAGNOSIS — J4520 Mild intermittent asthma, uncomplicated: Secondary | ICD-10-CM | POA: Diagnosis not present
